# Patient Record
Sex: Male | Born: 1960 | Race: White | Hispanic: No | Marital: Married | Smoking: Never smoker
Health system: Southern US, Community
[De-identification: ages and names within clinical notes are randomized; demographics above are authoritative.]

## PROBLEM LIST (undated history)

## (undated) DIAGNOSIS — L570 Actinic keratosis: Secondary | ICD-10-CM

## (undated) DIAGNOSIS — D099 Carcinoma in situ, unspecified: Secondary | ICD-10-CM

## (undated) DIAGNOSIS — I10 Essential (primary) hypertension: Secondary | ICD-10-CM

## (undated) HISTORY — PX: APPENDECTOMY: SHX54

## (undated) HISTORY — DX: Essential (primary) hypertension: I10

## (undated) HISTORY — DX: Carcinoma in situ, unspecified: D09.9

---
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
## 2005-10-10 ENCOUNTER — Ambulatory Visit: Payer: Self-pay | Admitting: Family Medicine

## 2005-11-01 ENCOUNTER — Ambulatory Visit: Payer: Self-pay | Admitting: General Surgery

## 2005-11-01 LAB — HM COLONOSCOPY

## 2006-08-20 ENCOUNTER — Emergency Department: Payer: Self-pay | Admitting: Emergency Medicine

## 2007-05-07 IMAGING — CT CT STONE STUDY
1 of 2 series · 15 of 32 positions shown, 19 images · non-contrast
Comparison: none

REASON FOR EXAM: History of renal stones, flank pain and hematuria
COMMENTS:   LMP: (Male)

[Series 2: stone · axial · 0.74mm/px · z∈[-484,-72]mm · 15 of 154 slices shown, 19 images]
[im 11/154  soft-tissue]
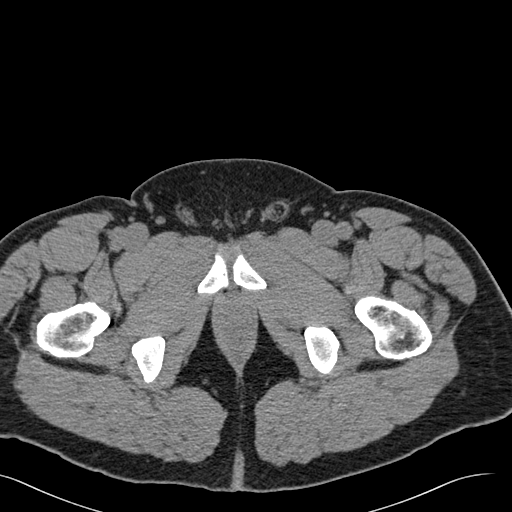
[im 11/154  bone]
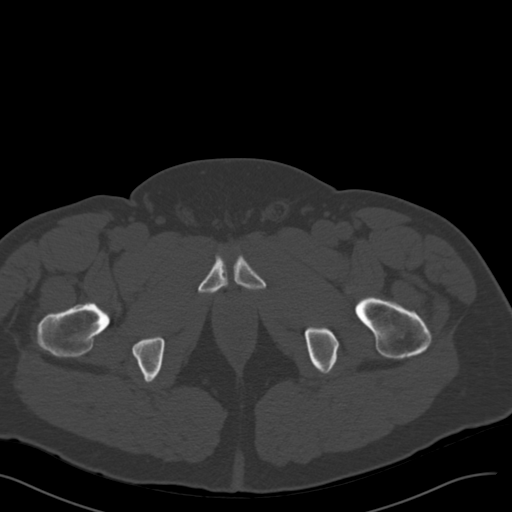
[im 22/154  soft-tissue]
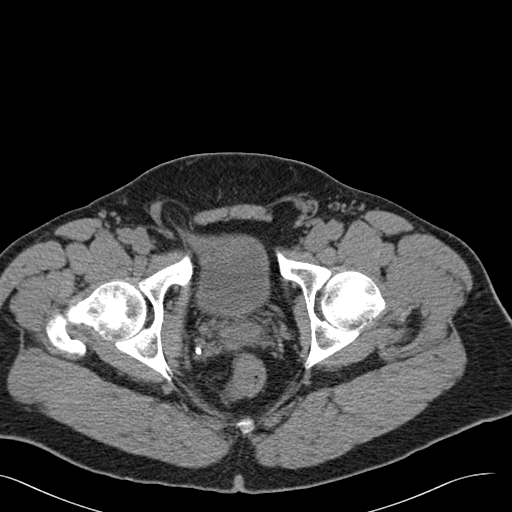
[im 33/154  soft-tissue]
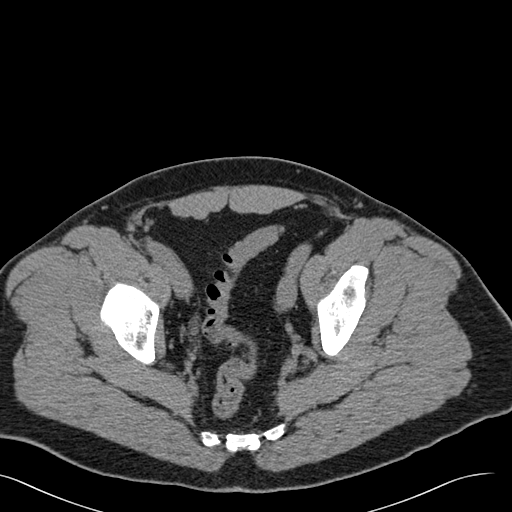
[im 44/154  soft-tissue]
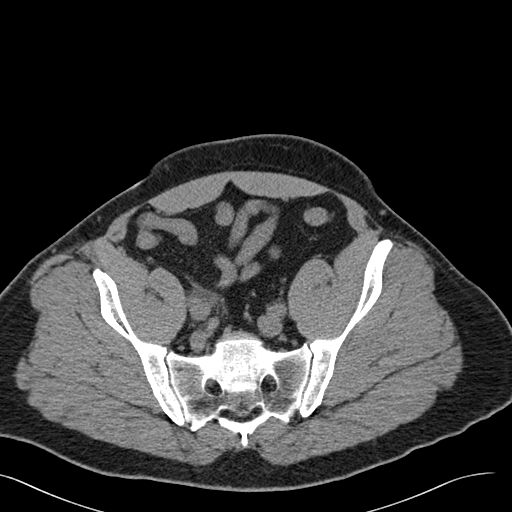
[im 55/154  soft-tissue]
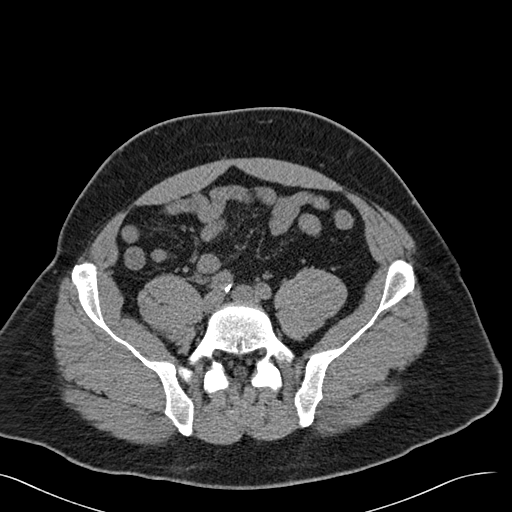
[im 66/154  soft-tissue]
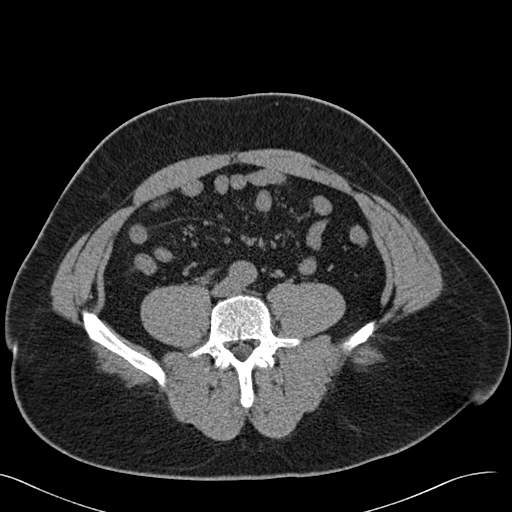
[im 77/154  soft-tissue]
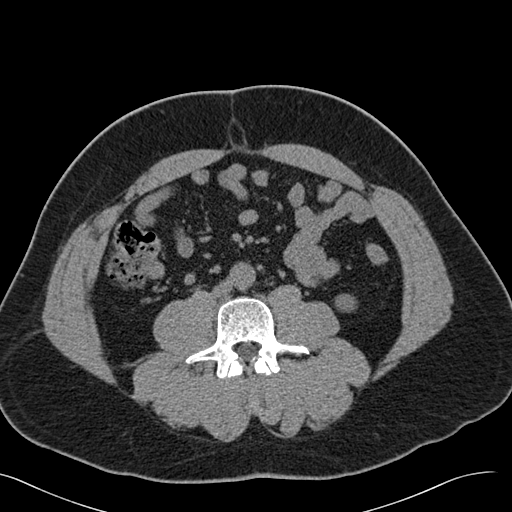
[im 88/154  soft-tissue]
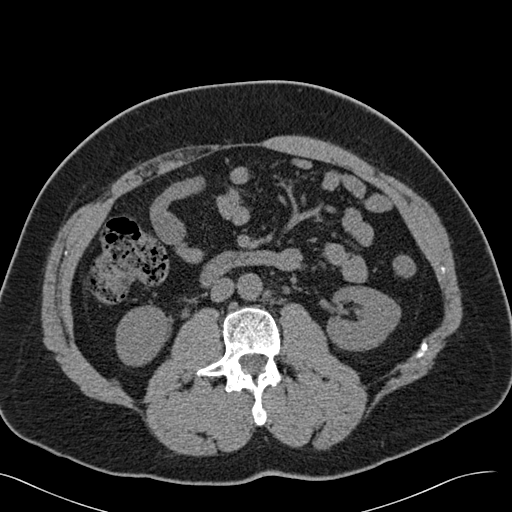
[im 99/154  soft-tissue]
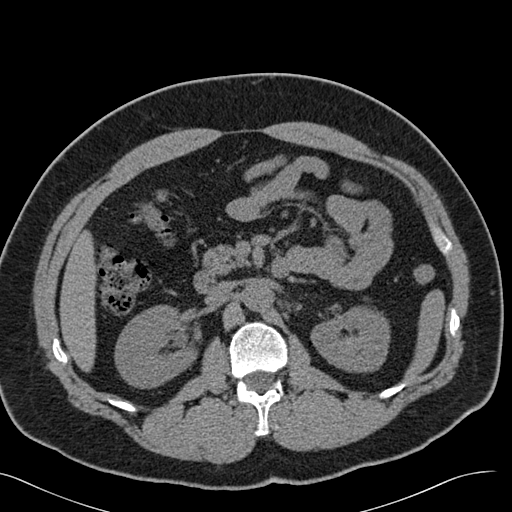
[im 99/154  bone]
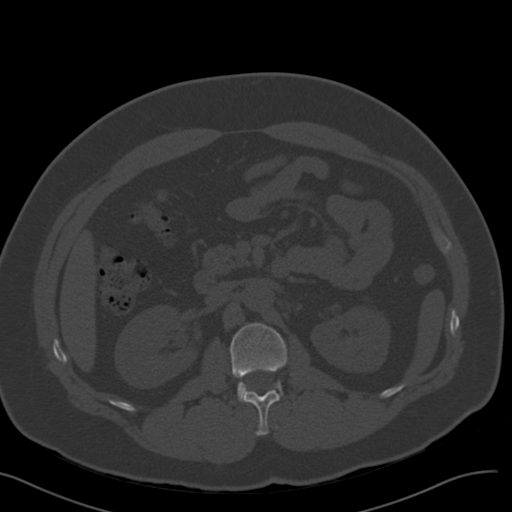
[im 110/154  soft-tissue]
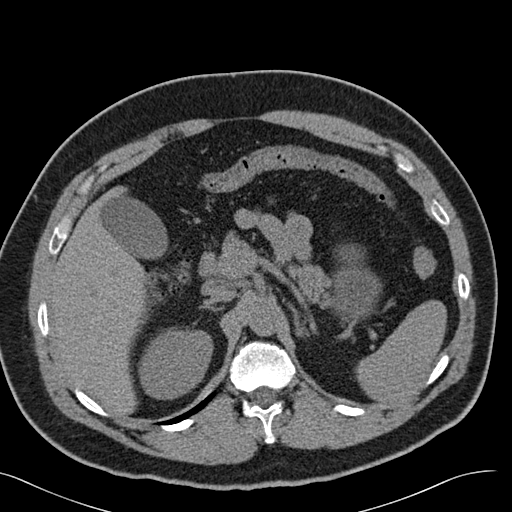
[im 121/154  soft-tissue]
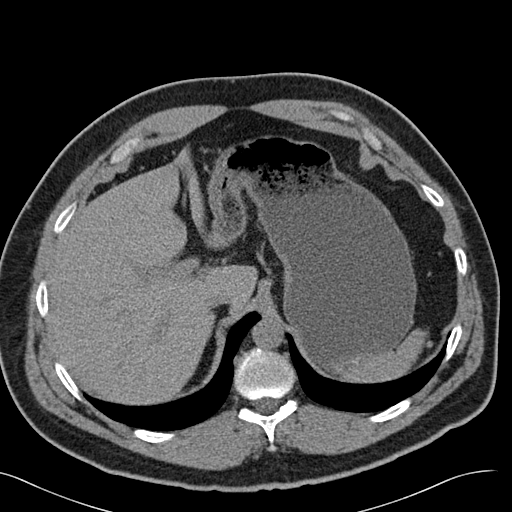
[im 132/154  soft-tissue]
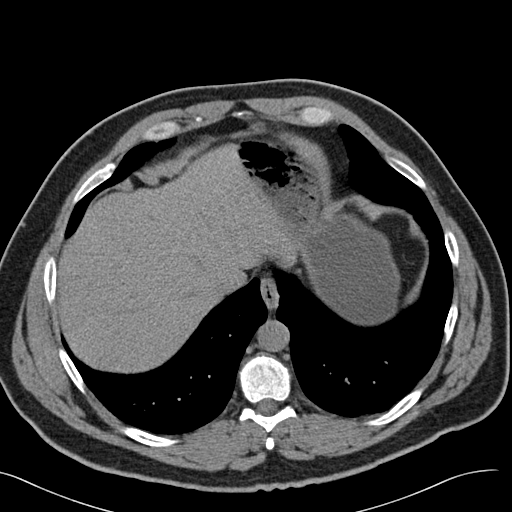
[im 132/154  lung]
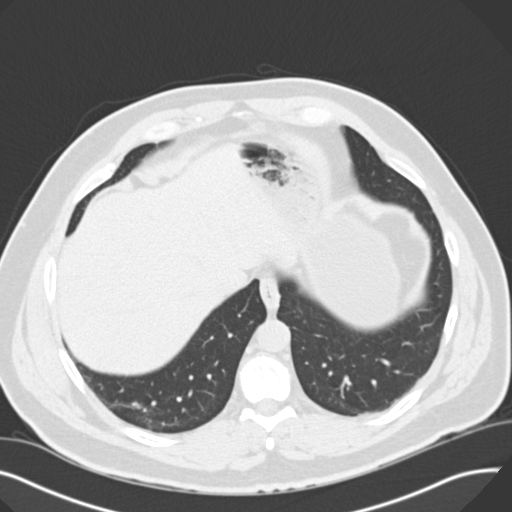
[im 137/154  lung]
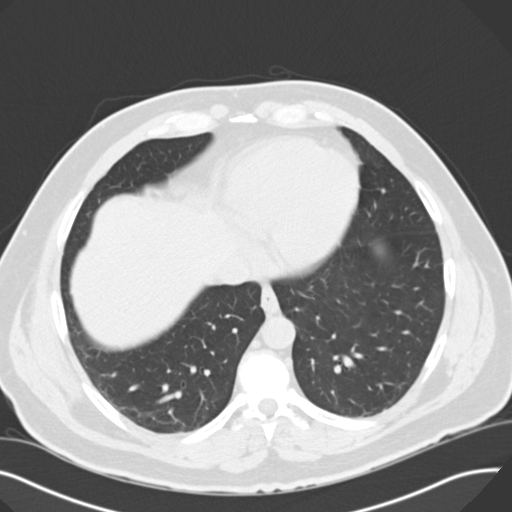
[im 143/154  soft-tissue]
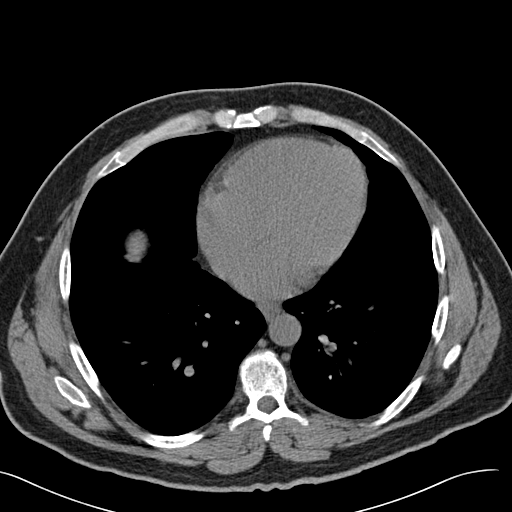
[im 143/154  lung]
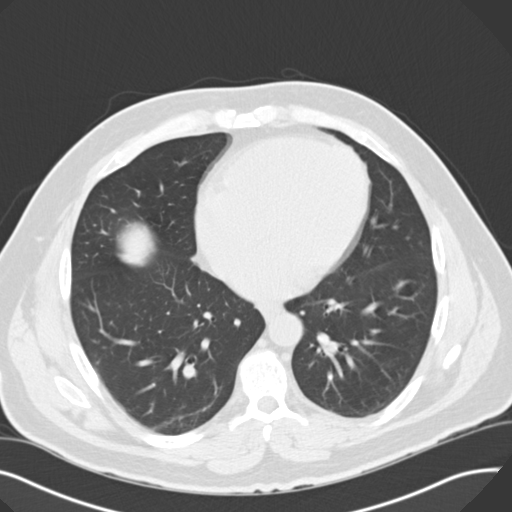
[im 148/154  lung]
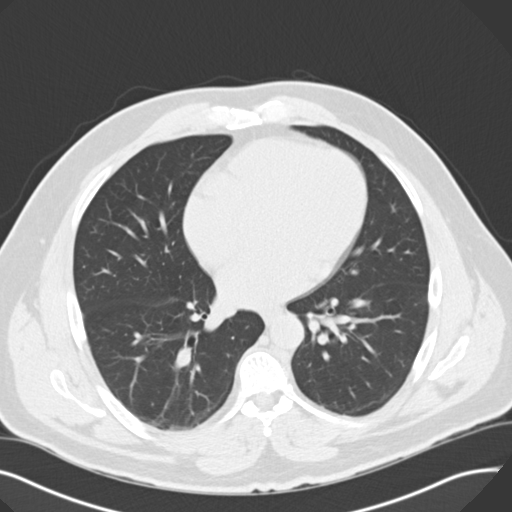

[15 of 32 positions shown; findings below may reference images not displayed]

PROCEDURE:     CT  - CT ABDOMEN /PELVIS WO (STONE)  - August 20, 2006  [DATE]

RESULT:     The patient has a history of RIGHT flank pain and stones.
Comparison is made to study 10 October, 2005. The RIGHT kidney exhibits normal
density. The perinephric fat is normal. There is a tiny calcification in a
lower pole calyx, but there is no evidence of obstruction. The RIGHT ureter,
however, appears very mildly dilated and there is increased density in the
periureteral fat. A stone is seen at the RIGHT ureterovesical junction,
which measures approximately 2 mm in diameter. The LEFT UVJ is normal in
appearance. The LEFT ureter appears normal in caliber with no evidence of
surrounding inflammation. The LEFT kidney exhibits an approximately 4 mm
diameter mid to upper pole non-obstructing stone.

The liver, spleen, pancreas, gallbladder, and adrenal glands are normal in
appearance. The stomach is distended with fluid and food. The caliber of the
abdominal aorta is normal. There is no evidence of ascites. The unopacified
loops of small and large bowel are normal in appearance. The lung bases are
clear. There are small bilateral inguinal hernias that contain fat.
IMPRESSION: 1. There is no significant hydronephrosis on the RIGHT, but very mild
hydroureter is present with a 2 mm diameter ureterovesical junction stone
present. There is no evidence of obstruction on the LEFT. Non-obstructing
stones are present bilaterally.
2. I see no acute abnormality elsewhere within the abdomen or pelvis.

The findings were called to the [HOSPITAL] the conclusion of
the study.

## 2007-11-07 ENCOUNTER — Emergency Department: Payer: Self-pay | Admitting: Emergency Medicine

## 2008-03-10 DIAGNOSIS — C4491 Basal cell carcinoma of skin, unspecified: Secondary | ICD-10-CM

## 2008-03-10 HISTORY — DX: Basal cell carcinoma of skin, unspecified: C44.91

## 2009-09-25 ENCOUNTER — Emergency Department: Payer: Self-pay | Admitting: Internal Medicine

## 2009-10-01 ENCOUNTER — Emergency Department: Payer: Self-pay | Admitting: Emergency Medicine

## 2010-10-11 ENCOUNTER — Ambulatory Visit: Payer: Self-pay | Admitting: Family Medicine

## 2011-06-28 IMAGING — RF DG BARIUM SWALLOW
1 series · 12 of 12 positions shown · non-contrast
Comparison: none

REASON FOR EXAM: dysphagia
COMMENTS:

[Series 1: run · 12 of 12 slices shown]
[im 1/12]
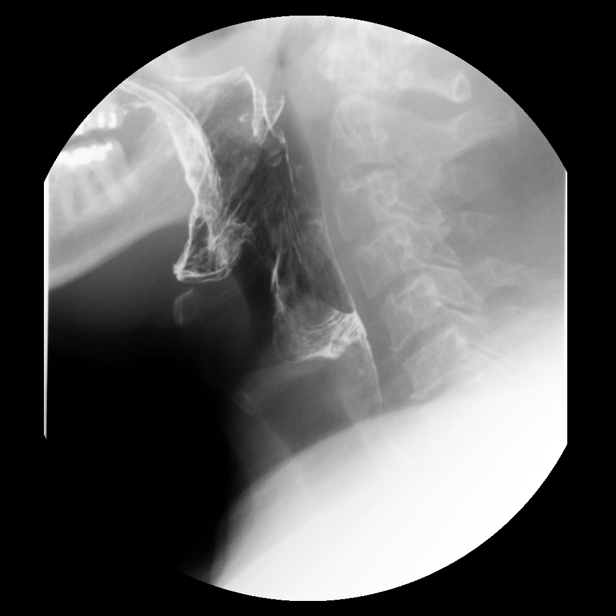
[im 2/12]
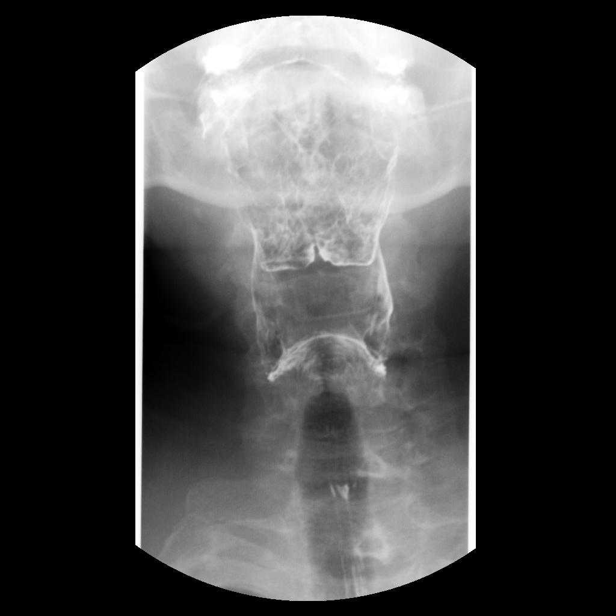
[im 3/12]
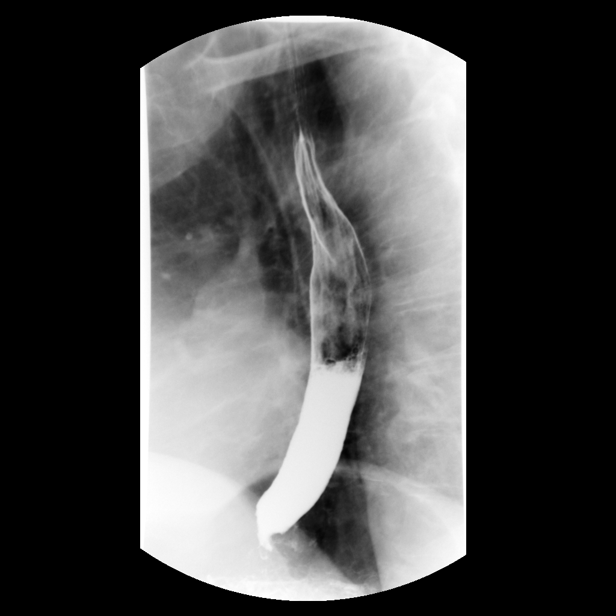
[im 4/12]
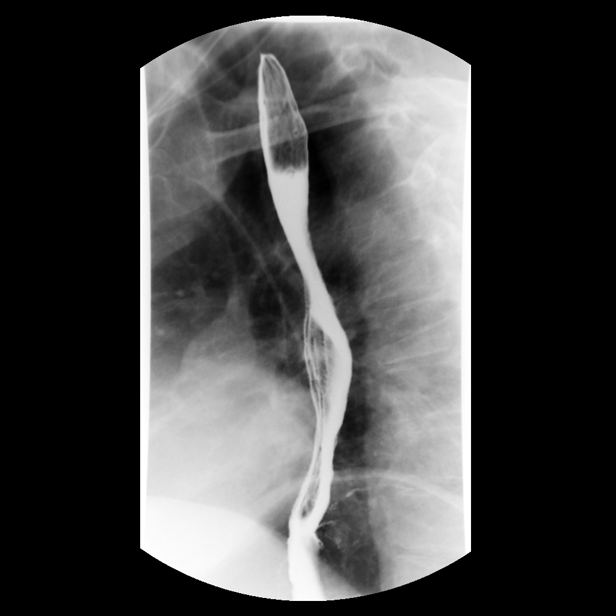
[im 5/12]
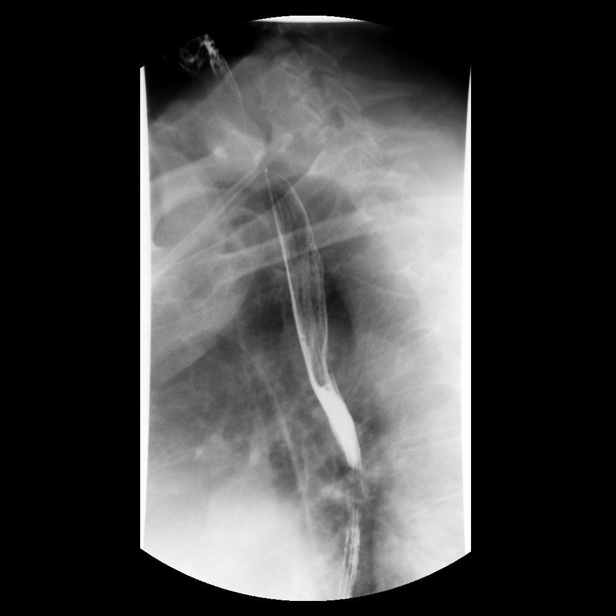
[im 6/12]
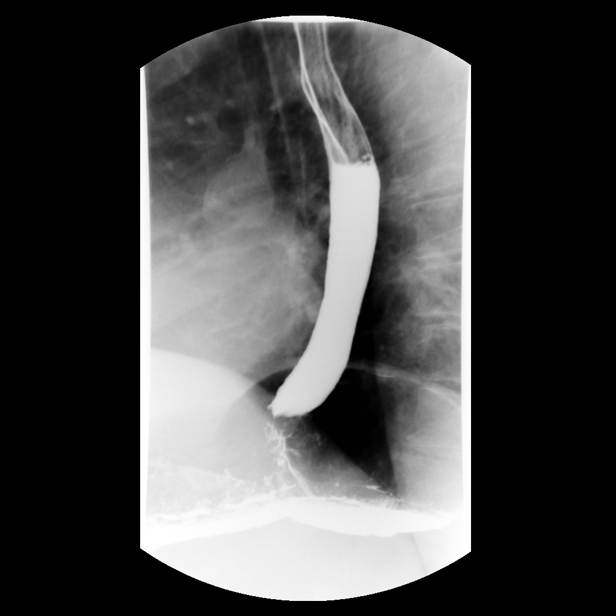
[im 7/12]
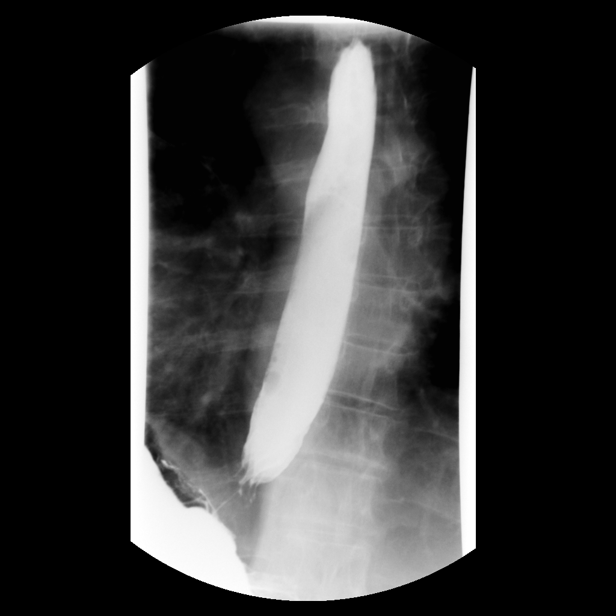
[im 8/12]
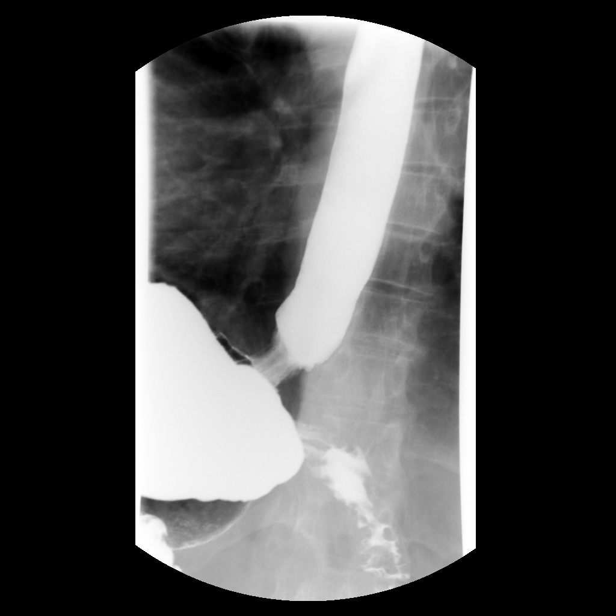
[im 9/12]
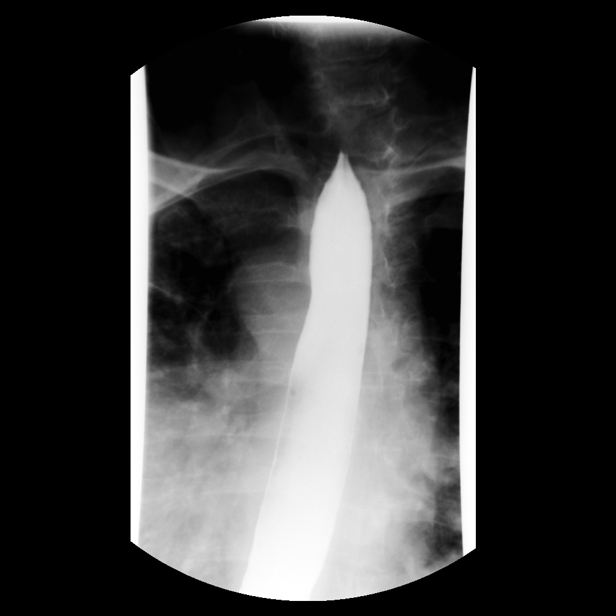
[im 10/12]
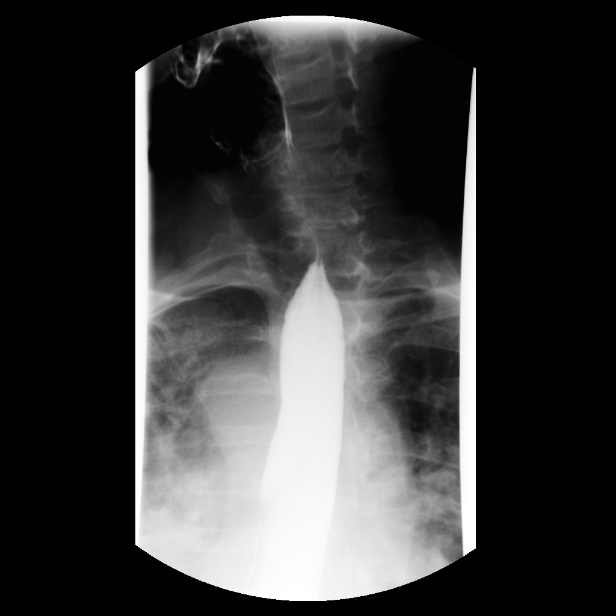
[im 11/12]
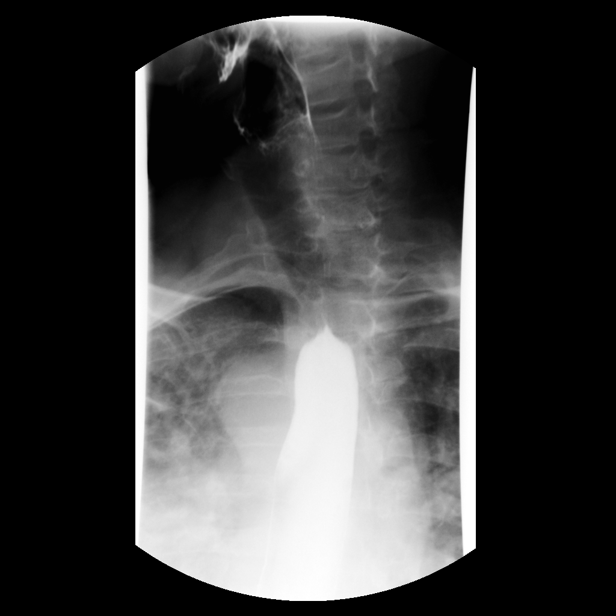
[im 12/12]
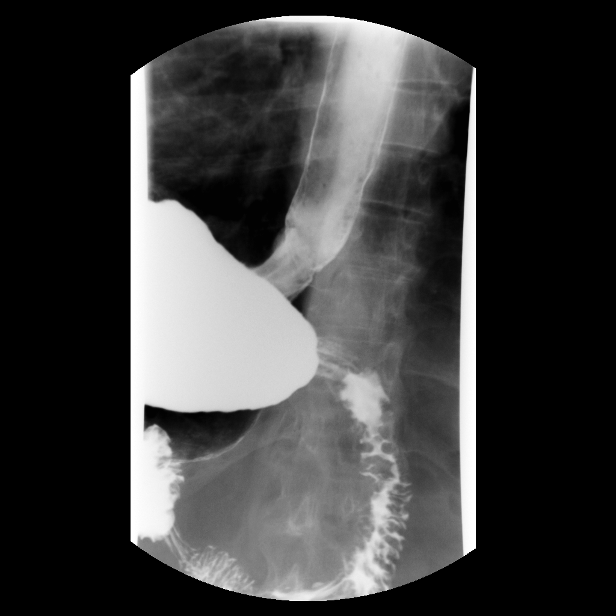

[12 of 12 positions shown; findings below may reference images not displayed]

PROCEDURE:     FL  - FL BARIUM SWALLOW  - October 11, 2010  [DATE]

RESULT:     The anticipated procedure was discussed with Mr. there now. He
voiced his willingness to proceed. The patient ingested barium without
difficulty. The cervical esophagus distended well. There was no laryngeal
penetration of the barium. The thoracic esophagus distended well. Esophageal
motility appeared normal. No gastroesophageal reflux was demonstrated. There
was no evidence of a hiatal hernia. The 12 mm barium pill passed without
difficulty.
IMPRESSION: Normal upper GI series.

## 2011-11-07 DIAGNOSIS — C4492 Squamous cell carcinoma of skin, unspecified: Secondary | ICD-10-CM

## 2011-11-07 HISTORY — DX: Squamous cell carcinoma of skin, unspecified: C44.92

## 2013-11-21 DIAGNOSIS — D099 Carcinoma in situ, unspecified: Secondary | ICD-10-CM

## 2013-11-21 HISTORY — DX: Carcinoma in situ, unspecified: D09.9

## 2017-06-15 ENCOUNTER — Encounter: Payer: Self-pay | Admitting: Physician Assistant

## 2017-06-15 ENCOUNTER — Ambulatory Visit (INDEPENDENT_AMBULATORY_CARE_PROVIDER_SITE_OTHER): Payer: BLUE CROSS/BLUE SHIELD | Admitting: Physician Assistant

## 2017-06-15 VITALS — BP 148/92 | HR 84 | Temp 98.8°F | Resp 16 | Ht 65.0 in | Wt 205.0 lb

## 2017-06-15 DIAGNOSIS — Z1329 Encounter for screening for other suspected endocrine disorder: Secondary | ICD-10-CM | POA: Diagnosis not present

## 2017-06-15 DIAGNOSIS — Z130 Encounter for screening for diseases of the blood and blood-forming organs and certain disorders involving the immune mechanism: Secondary | ICD-10-CM

## 2017-06-15 DIAGNOSIS — Z13220 Encounter for screening for lipoid disorders: Secondary | ICD-10-CM

## 2017-06-15 DIAGNOSIS — R030 Elevated blood-pressure reading, without diagnosis of hypertension: Secondary | ICD-10-CM

## 2017-06-15 DIAGNOSIS — Z1159 Encounter for screening for other viral diseases: Secondary | ICD-10-CM

## 2017-06-15 DIAGNOSIS — Z114 Encounter for screening for human immunodeficiency virus [HIV]: Secondary | ICD-10-CM | POA: Diagnosis not present

## 2017-06-15 DIAGNOSIS — Z85828 Personal history of other malignant neoplasm of skin: Secondary | ICD-10-CM

## 2017-06-15 DIAGNOSIS — Z131 Encounter for screening for diabetes mellitus: Secondary | ICD-10-CM | POA: Diagnosis not present

## 2017-06-15 DIAGNOSIS — Z0000 Encounter for general adult medical examination without abnormal findings: Secondary | ICD-10-CM

## 2017-06-15 DIAGNOSIS — Z125 Encounter for screening for malignant neoplasm of prostate: Secondary | ICD-10-CM

## 2017-06-15 NOTE — Patient Instructions (Signed)

## 2017-06-15 NOTE — Progress Notes (Signed)
Patient: Curtis Combs, Male    DOB: 1960/09/25, 57 y.o.   MRN: [id number] Visit Date: 06/16/2017  Today's Provider: Trinna Post, PA-C   Chief Complaint  Patient presents with  . Establish Care  . Annual Exam   Subjective:    Annual physical exam Curtis Combs is a 57 y.o. male who presents today for health maintenance and complete physical. He feels well. He reports exercising some. He reports he is sleeping well.  He was a patient here > 5 years ago, hasn't been seen since then. Previously saw Dr. Caryn Section.   He is living in [place], [place] with his wife of 33 years. He is a Forensic scientist.  Kids: two kids, son 27 and son 70 Grandkids: two,  girl 4 yrs, boy 4 mo  He reports he had colonoscopy by Dr Bary Castilla several years ago which he reports was normal.   Grandmother with colon cancer, but no 1st degree relatives. No hx prostate cancer.  He has a history of kidney stones and left ureteral stent placed by Dr. Jacqlyn Larsen at [facility], [place]. He has been advised to increase clear fluids.   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 He goes for a skin check every year. -----------------------------------------------------------------   Review of Systems  Constitutional: Negative.   HENT: Negative.   Eyes: Negative.   Respiratory: Negative.   Cardiovascular: Negative.   Gastrointestinal: Negative.   Endocrine: Negative.   Genitourinary: Negative.   Musculoskeletal: Negative.   Skin: Negative.   Allergic/Immunologic: Negative.   Neurological: Negative.   Hematological: Negative.   Psychiatric/Behavioral: Negative.     Social History      He  reports that  has never smoked. he has never used smokeless tobacco. He reports that he does not drink alcohol or use drugs.       Social History   Socioeconomic History  . Marital status: Married    Spouse name: None  . Number of children: None  . Years of education:  None  . Highest education level: None  Social Needs  . Financial resource strain: None  . Food insecurity - worry: None  . Food insecurity - inability: None  . Transportation needs - medical: None  . Transportation needs - non-medical: None  Occupational History  . None  Tobacco Use  . Smoking status: Never Smoker  . Smokeless tobacco: Never Used  Substance and Sexual Activity  . Alcohol use: No    Frequency: Never  . Drug use: No  . Sexual activity: None  Other Topics Concern  . None  Social History Narrative  . None    History reviewed. No pertinent past medical history.   There are no active problems to display for this patient.   Past Surgical History:  Procedure Laterality Date  . APPENDECTOMY    . [place]    Family History        Family Status  Relation Name Status  . Mother  Alive  . Father  Alive  . Brother  Alive  . MGM  Deceased  . Mat Aunt  (Not Specified)        His family history includes Cirrhosis in his maternal aunt and mother; Colon cancer in his maternal grandmother; Diabetes in his mother; Diverticulitis in his mother; Healthy in his brother.      No Known Allergies   Current Outpatient Medications:  .  aspirin EC 81 MG tablet, Take 81 mg by mouth., Disp: , Rfl:    Patient Care Team: Paulene Floor as PCP - General (Physician Assistant)      Objective:   Vitals: BP (!) 148/92 (BP Location: Right Arm, Patient Position: Sitting, Cuff Size: Normal)   Pulse 84   Temp 98.8 F (37.1 C) (Oral)   Resp 16   Ht 5\' 5"  (1.651 m)   Wt 205 lb (93 kg)   BMI 34.11 kg/m    Vitals:   06/15/17 1514  BP: (!) 148/92  Pulse: 84  Resp: 16  Temp: 98.8 F (37.1 C)  TempSrc: Oral  Weight: 205 lb (93 kg)  Height: 5\' 5"  (1.651 m)     Physical Exam  Constitutional: He is oriented to person, place, and time. He appears well-developed and well-nourished.  HENT:  Right Ear: External ear normal.  Left Ear: External ear  normal.  Eyes: Conjunctivae are normal.  Neck: Neck supple.  Cardiovascular: Normal rate and regular rhythm.  Pulmonary/Chest: Effort normal and breath sounds normal.  Genitourinary:  Genitourinary Comments: DRE declined.  Lymphadenopathy:    He has no cervical adenopathy.  Neurological: He is alert and oriented to person, place, and time.  Skin: Skin is warm and dry.  Psychiatric: He has a normal mood and affect. His behavior is normal.     Depression Screen PHQ 2/9 Scores 06/16/2017  Exception Documentation Patient refusal      Assessment & Plan:     Routine Health Maintenance and Physical Exam  Exercise Activities and Dietary recommendations Goals    None      Immunization History  Administered Date(s) Administered  . Tdap 10/18/2011    Health Maintenance  Topic Date Due  . Hepatitis C Screening  01/02/1962  . HIV Screening  06/21/1976  . COLONOSCOPY  06/22/2011  . TETANUS/TDAP  10/17/2021  . INFLUENZA VACCINE  Completed     Discussed health benefits of physical activity, and encouraged him to engage in regular exercise appropriate for his age and condition.    1. Annual physical exam  Requesting colonoscopy records from Dr. Bary Castilla.  2. Encounter for hepatitis C screening test for low risk patient  - Hepatitis c antibody (reflex)  3. Encounter for screening for HIV  - HIV antibody (with reflex)  4. Prostate cancer screening  - PSA  5. Screening cholesterol level  - Lipid Profile  6. Thyroid disorder screen  - TSH  7. Diabetes mellitus screening  - Comprehensive Metabolic Panel (CMET)  8. Screening for deficiency anemia  - CBC with Differential  9. History of SCC (squamous cell carcinoma) of skin  Continue yearly skin checks with Dr. Nehemiah Massed.   10. Elevated BP without diagnosis of hypertension  Blood pressure elevated today and elevated on recheck. Clinic notes from 5 years ago show borderline elevated BP. Suggest diet and  exercise modification to reduce BP. If BP not adequately reduced, suggest starting medication. Will check in 3 weeks.  Return in about 3 weeks (around 07/06/2017) for bp.  The entirety of the information documented in the History of Present Illness, Review of Systems and Physical Exam were personally obtained by me. Portions of this information were initially documented by Ashley Royalty, CMA and reviewed by me for thoroughness and accuracy.    --------------------------------------------------------------------    Trinna Post, PA-C  [facility]

## 2017-06-16 ENCOUNTER — Encounter: Payer: Self-pay | Admitting: Physician Assistant

## 2017-06-17 LAB — LIPID PANEL
Chol/HDL Ratio: 3 ratio (ref 0.0–5.0)
Cholesterol, Total: 161 mg/dL (ref 100–199)
HDL: 54 mg/dL (ref >39)
LDL Calculated: 94 mg/dL (ref 0–99)
Triglycerides: 65 mg/dL (ref 0–149)
VLDL Cholesterol Cal: 13 mg/dL (ref 5–40)

## 2017-06-17 LAB — COMPREHENSIVE METABOLIC PANEL
ALT: 30 IU/L (ref 0–44)
AST: 26 IU/L (ref 0–40)
Albumin/Globulin Ratio: 1.7 (ref 1.2–2.2)
Albumin: 4.3 g/dL (ref 3.5–5.5)
Alkaline Phosphatase: 72 IU/L (ref 39–117)
BUN/Creatinine Ratio: 14 (ref 9–20)
BUN: 14 mg/dL (ref 6–24)
Bilirubin Total: 0.9 mg/dL (ref 0.0–1.2)
CO2: 20 mmol/L (ref 20–29)
Calcium: 9.4 mg/dL (ref 8.7–10.2)
Chloride: 104 mmol/L (ref 96–106)
Creatinine, Ser: 1.03 mg/dL (ref 0.76–1.27)
GFR calc Af Amer: 94 mL/min/{1.73_m2} (ref >59)
GFR calc non Af Amer: 81 mL/min/{1.73_m2} (ref >59)
Globulin, Total: 2.6 g/dL (ref 1.5–4.5)
Glucose: 93 mg/dL (ref 65–99)
Potassium: 4.5 mmol/L (ref 3.5–5.2)
Sodium: 138 mmol/L (ref 134–144)
Total Protein: 6.9 g/dL (ref 6.0–8.5)

## 2017-06-17 LAB — CBC WITH DIFFERENTIAL/PLATELET
Basophils Absolute: 0 10*3/uL (ref 0.0–0.2)
Basos: 1 %
EOS (ABSOLUTE): 0.2 10*3/uL (ref 0.0–0.4)
Eos: 2 %
Hematocrit: 47.6 % (ref 37.5–51.0)
Hemoglobin: 15.7 g/dL (ref 13.0–17.7)
Immature Grans (Abs): 0 10*3/uL (ref 0.0–0.1)
Immature Granulocytes: 0 %
Lymphocytes Absolute: 2.6 10*3/uL (ref 0.7–3.1)
Lymphs: 38 %
MCH: 29.5 pg (ref 26.6–33.0)
MCHC: 33 g/dL (ref 31.5–35.7)
MCV: 89 fL (ref 79–97)
Monocytes Absolute: 0.6 10*3/uL (ref 0.1–0.9)
Monocytes: 8 %
Neutrophils Absolute: 3.5 10*3/uL (ref 1.4–7.0)
Neutrophils: 51 %
Platelets: 288 10*3/uL (ref 150–379)
RBC: 5.33 x10E6/uL (ref 4.14–5.80)
RDW: 13.2 % (ref 12.3–15.4)
WBC: 6.9 10*3/uL (ref 3.4–10.8)

## 2017-06-17 LAB — HIV ANTIBODY (ROUTINE TESTING W REFLEX): HIV Screen 4th Generation wRfx: NONREACTIVE

## 2017-06-17 LAB — HEPATITIS C ANTIBODY (REFLEX): HCV Ab: <0.1 s/co ratio (ref 0.0–0.9)

## 2017-06-17 LAB — HCV COMMENT:

## 2017-06-17 LAB — TSH: TSH: 1.83 u[IU]/mL (ref 0.450–4.500)

## 2017-06-17 LAB — PSA: Prostate Specific Ag, Serum: 0.6 ng/mL (ref 0.0–4.0)

## 2017-06-19 ENCOUNTER — Telehealth: Payer: Self-pay

## 2017-06-19 ENCOUNTER — Ambulatory Visit: Payer: Self-pay | Admitting: Physician Assistant

## 2017-06-19 NOTE — Telephone Encounter (Signed)
-----   Message from Trinna Post, [place] sent at 06/17/2017  9:06 AM EST ----- CMET no DM, normal kidney and liver function. CBC no infection or anemia. Cholesterol normal. TSH normal. PSA normal. HCV and HIV non reactive.

## 2017-06-19 NOTE — Telephone Encounter (Signed)
Tried calling; no answer.   Thanks,   -Laura  

## 2017-06-20 NOTE — Telephone Encounter (Signed)
Pt advised.   Thanks,   -Laura  

## 2017-06-20 NOTE — Telephone Encounter (Signed)
LMTCB 06/20/2017   Thanks,   -Mickel Baas

## 2017-07-03 ENCOUNTER — Ambulatory Visit (INDEPENDENT_AMBULATORY_CARE_PROVIDER_SITE_OTHER): Payer: BLUE CROSS/BLUE SHIELD | Admitting: Physician Assistant

## 2017-07-03 ENCOUNTER — Telehealth: Payer: Self-pay | Admitting: Physician Assistant

## 2017-07-03 ENCOUNTER — Encounter: Payer: Self-pay | Admitting: Physician Assistant

## 2017-07-03 VITALS — HR 84 | Temp 98.5°F | Resp 16 | Wt 204.0 lb

## 2017-07-03 DIAGNOSIS — I10 Essential (primary) hypertension: Secondary | ICD-10-CM | POA: Diagnosis not present

## 2017-07-03 DIAGNOSIS — Z1211 Encounter for screening for malignant neoplasm of colon: Secondary | ICD-10-CM

## 2017-07-03 MED ORDER — AMLODIPINE BESYLATE 5 MG PO TABS: 5.0000 mg | ORAL_TABLET | Freq: Every day | ORAL | 0 refills | Status: DC

## 2017-07-03 NOTE — Patient Instructions (Signed)

## 2017-07-03 NOTE — Telephone Encounter (Signed)
Order for cologuard faxed to [facility] °

## 2017-07-03 NOTE — Progress Notes (Signed)
       Patient: Curtis Combs Male    DOB: [birth year]   57 y.o.   MRN: [id number] Visit Date: 07/03/2017  Today's Provider: Trinna Post, PA-C   Chief Complaint  Patient presents with  . Hypertension    Elevated BP; One month follow up   Subjective:    Curtis Combs is a 57 y/o man presenting today for high BP. His blood pressure was elevated three weeks ago and he is here for recheck. Weight is stable. Cholesterol well controlled.   Hypertension  This is a new problem. 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 ). Pertinent negatives include no anxiety, blurred vision, chest pain, headaches, malaise/fatigue, neck pain, orthopnea, palpitations, peripheral edema, PND, shortness of breath or sweats. There are no associated agents to hypertension. Past treatments include lifestyle changes. There are no compliance problems.        No Known Allergies   Current Outpatient Medications:  .  aspirin EC 81 MG tablet, Take 81 mg by mouth., Disp: , Rfl:   Review of Systems  Constitutional: Negative.  Negative for malaise/fatigue.  Eyes: Negative for blurred vision.  Respiratory: Negative.  Negative for shortness of breath.   Cardiovascular: Negative.  Negative for chest pain, palpitations, orthopnea and PND.  Gastrointestinal: Negative.   Musculoskeletal: Negative for neck pain.  Neurological: Negative for dizziness, light-headedness and headaches.    Social History   Tobacco Use  . Smoking status: Never Smoker  . Smokeless tobacco: Never Used  Substance Use Topics  . Alcohol use: No    Frequency: Never   Objective:   Pulse 84   Temp 98.5 F (36.9 C) (Oral)   Resp 16   Wt 204 lb (92.5 kg)   BMI 33.95 kg/m  Vitals:   07/03/17 1538  Pulse: 84  Resp: 16  Temp: 98.5 F (36.9 C)  TempSrc: Oral  Weight: 204 lb (92.5 kg)     Physical Exam  Constitutional: He is oriented to person, place, and time. He appears  well-developed and well-nourished.  Cardiovascular: Normal rate and regular rhythm.  Pulmonary/Chest: Effort normal and breath sounds normal.  Musculoskeletal: He exhibits no edema.  Neurological: He is alert and oriented to person, place, and time.  Skin: Skin is warm and dry.  Psychiatric: He has a normal mood and affect. His behavior is normal.        Assessment & Plan:     1. Hypertension, unspecified type  Uncontrolled HTN, will begin norvasc as below. Counseled on pedal edema. Encouraged increased exercise and weight loss, if BP decreases with these interventions we may discontinue the medication.   - amLODipine (NORVASC) 5 MG tablet; Take 1 tablet (5 mg total) by mouth daily.  Dispense: 90 tablet; Refill: 0  2. Colon cancer screening  - Cologuard  Return in about 1 month (around 07/31/2017) for HTN.  The entirety of the information documented in the History of Present Illness, Review of Systems and Physical Exam were personally obtained by me. Portions of this information were initially documented by Ashley Royalty, CMA and reviewed by me for thoroughness and accuracy.         Trinna Post, PA-C  [facility]

## 2017-07-04 DIAGNOSIS — I10 Essential (primary) hypertension: Secondary | ICD-10-CM | POA: Insufficient documentation

## 2017-07-05 ENCOUNTER — Encounter: Payer: Self-pay | Admitting: Physician Assistant

## 2017-07-10 LAB — COLOGUARD: COLOGUARD: NEGATIVE

## 2017-07-25 ENCOUNTER — Encounter: Payer: Self-pay | Admitting: Physician Assistant

## 2017-07-31 ENCOUNTER — Ambulatory Visit (INDEPENDENT_AMBULATORY_CARE_PROVIDER_SITE_OTHER): Payer: Managed Care, Other (non HMO) | Admitting: Physician Assistant

## 2017-07-31 ENCOUNTER — Encounter: Payer: Self-pay | Admitting: Physician Assistant

## 2017-07-31 VITALS — BP 116/84 | HR 68 | Temp 98.6°F | Resp 16 | Wt 199.0 lb

## 2017-07-31 DIAGNOSIS — I10 Essential (primary) hypertension: Secondary | ICD-10-CM | POA: Diagnosis not present

## 2017-07-31 NOTE — Progress Notes (Signed)
Patient: Curtis Combs Male    DOB: 1960/11/13   57 y.o.   MRN: [id number] Visit Date: 07/31/2017  Today's Provider: Trinna Post, PA-C   Chief Complaint  Patient presents with  . Hypertension    One month follow up   Subjective:    Curtis Combs is here today for follow up of BP. He has started amlodipine 5 mg daily last visit. He has had some pedal edema but otherwise no side effects. He has lost five pounds since his visit last month. He has been eating better, salads for lunch, working out more.   Hypertension  Associated symptoms include peripheral edema. Pertinent negatives include no anxiety, blurred vision, chest pain, headaches, malaise/fatigue, neck pain, orthopnea, palpitations, PND, shortness of breath or sweats. There are no associated agents to hypertension. Risk factors for coronary artery disease include male gender. Past treatments include calcium channel blockers. There are no compliance problems.    BP Readings from Last 3 Encounters:  07/31/17 116/84  06/15/17 (!) 148/92        No Known Allergies   Current Outpatient Medications:  .  amLODipine (NORVASC) 5 MG tablet, Take 1 tablet (5 mg total) by mouth daily., Disp: 90 tablet, Rfl: 0 .  aspirin EC 81 MG tablet, Take 81 mg by mouth., Disp: , Rfl:   Review of Systems  Constitutional: Negative.  Negative for malaise/fatigue.  Eyes: Negative for blurred vision.  Respiratory: Negative.  Negative for shortness of breath.   Cardiovascular: Positive for leg swelling. Negative for chest pain, palpitations, orthopnea and PND.  Musculoskeletal: Negative for neck pain.  Neurological: Negative for dizziness, light-headedness and headaches.    Social History   Tobacco Use  . Smoking status: Never Smoker  . Smokeless tobacco: Never Used  Substance Use Topics  . Alcohol use: No    Frequency: Never   Objective:   BP 116/84 (BP Location: Right Arm, Patient Position: Sitting, Cuff Size:  Normal)   Pulse 68   Temp 98.6 F (37 C) (Oral)   Resp 16   Wt 199 lb (90.3 kg)   BMI 33.12 kg/m  Vitals:   07/31/17 1608  BP: 116/84  Pulse: 68  Resp: 16  Temp: 98.6 F (37 C)  TempSrc: Oral  Weight: 199 lb (90.3 kg)     Physical Exam  Constitutional: He is oriented to person, place, and time. He appears well-developed and well-nourished.  Cardiovascular: Normal rate and regular rhythm.  Pulmonary/Chest: Effort normal and breath sounds normal.  Musculoskeletal: He exhibits edema.  Mild non pitting edema in extremities bilaterally.   Neurological: He is alert and oriented to person, place, and time.  Skin: Skin is warm and dry.  Psychiatric: He has a normal mood and affect. His behavior is normal.        Assessment & Plan:     1. Hypertension, unspecified type  HTN controlled on 5 mg amlodipine. He does have some pedal edema, which I have counseled is a side effect and if it does not bother him, we may continue medication. He is agreeable. Congratulated on weight loss. Will see him in one year for CPE/follow up.  Return in about 1 year (around 08/01/2018) for CPE.  The entirety of the information documented in the History of Present Illness, Review of Systems and Physical Exam were personally obtained by me. Portions of this information were initially documented by Ashley Royalty, CMA and reviewed by me for thoroughness  and accuracy.          Trinna Post, PA-C  [facility]

## 2017-07-31 NOTE — Patient Instructions (Signed)

## 2017-08-10 LAB — COLOGUARD

## 2017-09-21 ENCOUNTER — Other Ambulatory Visit: Payer: Self-pay | Admitting: Physician Assistant

## 2017-09-21 DIAGNOSIS — I10 Essential (primary) hypertension: Secondary | ICD-10-CM

## 2017-09-22 NOTE — Telephone Encounter (Signed)
Pharmacy requesting refills. Thanks!  

## 2017-12-20 ENCOUNTER — Other Ambulatory Visit: Payer: Self-pay | Admitting: Physician Assistant

## 2017-12-20 DIAGNOSIS — I10 Essential (primary) hypertension: Secondary | ICD-10-CM

## 2018-03-21 ENCOUNTER — Other Ambulatory Visit: Payer: Self-pay | Admitting: Physician Assistant

## 2018-03-21 DIAGNOSIS — I10 Essential (primary) hypertension: Secondary | ICD-10-CM

## 2018-09-14 ENCOUNTER — Other Ambulatory Visit: Payer: Self-pay | Admitting: Physician Assistant

## 2018-09-14 DIAGNOSIS — I10 Essential (primary) hypertension: Secondary | ICD-10-CM

## 2018-09-17 NOTE — Telephone Encounter (Signed)
LOv:07/31/2017

## 2018-09-17 NOTE — Telephone Encounter (Signed)
Please schedule follow up before more refills, not seen > 1 year.

## 2018-09-18 NOTE — Telephone Encounter (Signed)
LMTCB

## 2018-10-12 ENCOUNTER — Other Ambulatory Visit: Payer: Self-pay | Admitting: Physician Assistant

## 2018-10-12 DIAGNOSIS — I10 Essential (primary) hypertension: Secondary | ICD-10-CM

## 2018-10-13 ENCOUNTER — Other Ambulatory Visit: Payer: Self-pay | Admitting: Physician Assistant

## 2018-10-13 DIAGNOSIS — I10 Essential (primary) hypertension: Secondary | ICD-10-CM

## 2018-10-16 ENCOUNTER — Other Ambulatory Visit: Payer: Self-pay

## 2018-10-16 DIAGNOSIS — I10 Essential (primary) hypertension: Secondary | ICD-10-CM

## 2018-10-16 MED ORDER — AMLODIPINE BESYLATE 5 MG PO TABS: 5.0000 mg | ORAL_TABLET | Freq: Every day | ORAL | 1 refills | Status: DC

## 2018-11-19 DIAGNOSIS — C4492 Squamous cell carcinoma of skin, unspecified: Secondary | ICD-10-CM

## 2018-11-19 DIAGNOSIS — L570 Actinic keratosis: Secondary | ICD-10-CM

## 2018-11-19 HISTORY — DX: Actinic keratosis: L57.0

## 2018-11-19 HISTORY — DX: Squamous cell carcinoma of skin, unspecified: C44.92

## 2018-11-27 ENCOUNTER — Ambulatory Visit: Payer: Self-pay | Admitting: Physician Assistant

## 2018-12-17 ENCOUNTER — Encounter: Payer: Self-pay | Admitting: Physician Assistant

## 2018-12-17 ENCOUNTER — Ambulatory Visit: Payer: Managed Care, Other (non HMO) | Admitting: Physician Assistant

## 2018-12-17 ENCOUNTER — Other Ambulatory Visit: Payer: Self-pay

## 2018-12-17 VITALS — BP 120/82 | HR 76 | Temp 98.9°F | Resp 16 | Wt 199.0 lb

## 2018-12-17 DIAGNOSIS — Z125 Encounter for screening for malignant neoplasm of prostate: Secondary | ICD-10-CM | POA: Diagnosis not present

## 2018-12-17 DIAGNOSIS — Z0000 Encounter for general adult medical examination without abnormal findings: Secondary | ICD-10-CM | POA: Diagnosis not present

## 2018-12-17 DIAGNOSIS — I10 Essential (primary) hypertension: Secondary | ICD-10-CM

## 2018-12-17 MED ORDER — AMLODIPINE BESYLATE 5 MG PO TABS: 5.0000 mg | ORAL_TABLET | Freq: Every day | ORAL | 3 refills | Status: DC

## 2018-12-17 NOTE — Progress Notes (Signed)
Patient: Curtis Combs Male    DOB: 04/26/1961   58 y.o.   MRN: [id number] Visit Date: 12/18/2018  Today's Provider: Trinna Post, PA-C   Chief Complaint  Patient presents with  . Hypertension   Subjective:   HPI   Here today for CPE and follow up. Cologuard negative 07/20/2017. PSA 06/16/2017.     Hypertension, follow-up:  BP Readings from Last 3 Encounters:  12/17/18 120/82  07/31/17 116/84  06/15/17 (!) 148/92    He was last seen for hypertension 1 years ago.  BP at that visit was 116/84. Management since that visit includes no changes. He reports good compliance with treatment. He is not having side effects.  He is not exercising. He is adherent to low salt diet.   Outside blood pressures are checked occasionally. He is experiencing none.  Patient denies exertional chest pressure/discomfort, lower extremity edema and palpitations.    Weight trend: stable Wt Readings from Last 3 Encounters:  12/17/18 199 lb (90.3 kg)  07/31/17 199 lb (90.3 kg)  07/03/17 204 lb (92.5 kg)    Current diet: well balanced   No Known Allergies   Current Outpatient Medications:  .  amLODipine (NORVASC) 5 MG tablet, Take 1 tablet (5 mg total) by mouth daily., Disp: 90 tablet, Rfl: 3 .  aspirin EC 81 MG tablet, Take 81 mg by mouth., Disp: , Rfl:   Review of Systems  Constitutional: Negative for activity change and fatigue.  Respiratory: Negative for cough and shortness of breath.   Cardiovascular: Negative for chest pain, palpitations and leg swelling.  Musculoskeletal: Negative for arthralgias.  Allergic/Immunologic: Negative for environmental allergies.  Neurological: Negative for dizziness, light-headedness and headaches.  Psychiatric/Behavioral: Negative for agitation, self-injury and sleep disturbance. The patient is not nervous/anxious.     Social History   Tobacco Use  . Smoking status: Never Smoker  . Smokeless tobacco: Never Used  Substance Use  Topics  . Alcohol use: No    Frequency: Never      Objective:   BP 120/82 (BP Location: Left Arm, Patient Position: Sitting, Cuff Size: Large)   Pulse 76   Temp 98.9 F (37.2 C)   Resp 16   Wt 199 lb (90.3 kg)   SpO2 99%   BMI 33.12 kg/m  Vitals:   12/17/18 1616  BP: 120/82  Pulse: 76  Resp: 16  Temp: 98.9 F (37.2 C)  SpO2: 99%  Weight: 199 lb (90.3 kg)     Physical Exam Constitutional:      Appearance: Normal appearance.  HENT:     Right Ear: Tympanic membrane normal.     Left Ear: Tympanic membrane normal.     Mouth/Throat:     Mouth: Mucous membranes are moist.  Cardiovascular:     Rate and Rhythm: Normal rate and regular rhythm.     Heart sounds: Normal heart sounds.  Pulmonary:     Effort: Pulmonary effort is normal.     Breath sounds: Normal breath sounds.  Abdominal:     General: Abdomen is flat. Bowel sounds are normal.     Palpations: Abdomen is soft.  Skin:    General: Skin is warm and dry.  Neurological:     Mental Status: He is alert and oriented to person, place, and time. Mental status is at baseline.  Psychiatric:        Mood and Affect: Mood normal.        Behavior: Behavior normal.  No results found for any visits on 12/17/18.     Assessment & Plan    1. Annual physical exam  Screening exams up to date.   2. Hypertension, unspecified type  - Comprehensive Metabolic Panel (CMET) - Lipid Profile - amLODipine (NORVASC) 5 MG tablet; Take 1 tablet (5 mg total) by mouth daily.  Dispense: 90 tablet; Refill: 3  3. Prostate cancer screening  - PSA  The entirety of the information documented in the History of Present Illness, Review of Systems and Physical Exam were personally obtained by me. Portions of this information were initially documented by Wilburt Finlay, CMA and reviewed by me for thoroughness and accuracy.   F/u 1 year      Trinna Post, PA-C  [facility]

## 2018-12-20 LAB — COMPREHENSIVE METABOLIC PANEL
ALT: 28 IU/L (ref 0–44)
AST: 26 IU/L (ref 0–40)
Albumin/Globulin Ratio: 1.8 (ref 1.2–2.2)
Albumin: 4.3 g/dL (ref 3.8–4.9)
Alkaline Phosphatase: 66 IU/L (ref 39–117)
BUN/Creatinine Ratio: 13 (ref 9–20)
BUN: 14 mg/dL (ref 6–24)
Bilirubin Total: 0.6 mg/dL (ref 0.0–1.2)
CO2: 21 mmol/L (ref 20–29)
Calcium: 9.3 mg/dL (ref 8.7–10.2)
Chloride: 102 mmol/L (ref 96–106)
Creatinine, Ser: 1.1 mg/dL (ref 0.76–1.27)
GFR calc Af Amer: 86 mL/min/{1.73_m2} (ref >59)
GFR calc non Af Amer: 74 mL/min/{1.73_m2} (ref >59)
Globulin, Total: 2.4 g/dL (ref 1.5–4.5)
Glucose: 97 mg/dL (ref 65–99)
Potassium: 4.4 mmol/L (ref 3.5–5.2)
Sodium: 138 mmol/L (ref 134–144)
Total Protein: 6.7 g/dL (ref 6.0–8.5)

## 2018-12-20 LAB — LIPID PANEL
Chol/HDL Ratio: 3.1 ratio (ref 0.0–5.0)
Cholesterol, Total: 166 mg/dL (ref 100–199)
HDL: 53 mg/dL (ref >39)
LDL Calculated: 101 mg/dL — ABNORMAL HIGH (ref 0–99)
Triglycerides: 60 mg/dL (ref 0–149)
VLDL Cholesterol Cal: 12 mg/dL (ref 5–40)

## 2018-12-20 LAB — PSA: Prostate Specific Ag, Serum: 0.5 ng/mL (ref 0.0–4.0)

## 2018-12-24 ENCOUNTER — Telehealth: Payer: Self-pay

## 2018-12-24 NOTE — Telephone Encounter (Signed)
-----   Message from Trinna Post, [place] sent at 12/21/2018 12:54 PM EDT ----- Labs look great. Will see him next year.

## 2018-12-24 NOTE — Telephone Encounter (Signed)
Patient notified of lab results

## 2019-06-04 ENCOUNTER — Other Ambulatory Visit: Payer: Self-pay | Admitting: Physician Assistant

## 2019-06-04 DIAGNOSIS — I10 Essential (primary) hypertension: Secondary | ICD-10-CM

## 2019-06-04 MED ORDER — AMLODIPINE BESYLATE 5 MG PO TABS: 5.0000 mg | ORAL_TABLET | Freq: Every day | ORAL | 3 refills | Status: DC

## 2019-06-04 NOTE — Telephone Encounter (Signed)
L.O.V. was on 12/17/2018 and medication send into pharmacy.

## 2019-06-04 NOTE — Telephone Encounter (Signed)
[facility] faxed refill request for the following medications:  amLODipine (NORVASC) 5 MG tablet   Please advise.

## 2019-07-24 ENCOUNTER — Ambulatory Visit (INDEPENDENT_AMBULATORY_CARE_PROVIDER_SITE_OTHER): Payer: Managed Care, Other (non HMO) | Admitting: Dermatology

## 2019-07-24 ENCOUNTER — Encounter: Payer: Self-pay | Admitting: Dermatology

## 2019-07-24 ENCOUNTER — Other Ambulatory Visit: Payer: Self-pay

## 2019-07-24 DIAGNOSIS — Z86007 Personal history of in-situ neoplasm of skin: Secondary | ICD-10-CM

## 2019-07-24 DIAGNOSIS — C4441 Basal cell carcinoma of skin of scalp and neck: Secondary | ICD-10-CM | POA: Diagnosis not present

## 2019-07-24 DIAGNOSIS — D492 Neoplasm of unspecified behavior of bone, soft tissue, and skin: Secondary | ICD-10-CM

## 2019-07-24 DIAGNOSIS — Z1283 Encounter for screening for malignant neoplasm of skin: Secondary | ICD-10-CM | POA: Diagnosis not present

## 2019-07-24 DIAGNOSIS — L570 Actinic keratosis: Secondary | ICD-10-CM | POA: Diagnosis not present

## 2019-07-24 DIAGNOSIS — L820 Inflamed seborrheic keratosis: Secondary | ICD-10-CM

## 2019-07-24 DIAGNOSIS — Z85828 Personal history of other malignant neoplasm of skin: Secondary | ICD-10-CM

## 2019-07-24 DIAGNOSIS — L821 Other seborrheic keratosis: Secondary | ICD-10-CM | POA: Diagnosis not present

## 2019-07-24 DIAGNOSIS — L578 Other skin changes due to chronic exposure to nonionizing radiation: Secondary | ICD-10-CM

## 2019-07-24 HISTORY — DX: Personal history of other malignant neoplasm of skin: Z85.828

## 2019-07-24 NOTE — Patient Instructions (Signed)

## 2019-07-24 NOTE — Progress Notes (Signed)
Follow-Up Visit   Subjective  Curtis Combs is a 59 y.o. male who presents for the following: Annual Exam (TBSE hx BCC, SCC, SCC IS, AKs) and check spots (face, R post auricular x 3-85m).  The following portions of the chart were reviewed this encounter and updated as appropriate: Problems      Review of Systems: No other skin or systemic complaints.  Objective  Well appearing patient in no apparent distress; mood and affect are within normal limits.  A full examination was performed including scalp, head, eyes, ears, nose, lips, neck, chest, axillae, abdomen, back, buttocks, bilateral upper extremities, bilateral lower extremities, hands, feet, fingers, toes, fingernails, and toenails. All findings within normal limits unless otherwise noted below.  Objective  hands x 22, face x 7 (29): Pink scaly macules  Objective  face, body: Actinic changes   Objective  L mid lat calf, L forearm, L dorsum hand (3): Clear, no evidence of recurrence   Objective  superior ant deltoid: Clear. No evidence of recurrence.   Objective  R med cheek infraorbital 1.5cm inf to lower eyelashes x 1, arms x 18, legs x 3 (22): Stuck on waxy paps with erythema   Objective  R scalp post auricular: 0.6cm pearly pap  BCC vs other  Objective  bil legs: Stuck on waxy tan papules   Assessment & Plan  AK (actinic keratosis) (29) hands x 22, face x 7  Destruction of lesion - hands x 22, face x 7 Complexity: simple   Destruction method: cryotherapy   Informed consent: discussed and consent obtained   Timeout:  patient name, date of birth, surgical site, and procedure verified Lesion destroyed using liquid nitrogen: Yes   Region frozen until ice ball extended beyond lesion: Yes   Outcome: patient tolerated procedure well with no complications   Post-procedure details: wound care instructions given    Actinic skin damage face, body  Recommend broad spectrum SPF and photoprotection    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) L mid lat calf, L forearm, L dorsum hand  Observe   History of basal cell carcinoma (BCC) superior ant deltoid  Observe   Inflamed seborrheic keratosis (22) R med cheek infraorbital 1.5cm inf to lower eyelashes x 1, arms x 18, legs x 3  Destruction of lesion - R med cheek infraorbital 1.5cm inf to lower eyelashes x 1, arms x 18, legs x 3 Complexity: simple   Destruction method: cryotherapy   Informed consent: discussed and consent obtained   Timeout:  patient name, date of birth, surgical site, and procedure verified Lesion destroyed using liquid nitrogen: Yes   Region frozen until ice ball extended beyond lesion: Yes   Outcome: patient tolerated procedure well with no complications   Post-procedure details: wound care instructions given    Neoplasm of skin R scalp post auricular  Epidermal / dermal shaving  Lesion length (cm):  0.6 Lesion width (cm):  0.6 Margin per side (cm):  0.2 Total excision diameter (cm):  1 Informed consent: discussed and consent obtained   Timeout: patient name, date of birth, surgical site, and procedure verified   Procedure prep:  Patient was prepped and draped in usual sterile fashion Prep type:  Isopropyl alcohol Anesthesia: the lesion was anesthetized in a standard fashion   Anesthetic:  1% lidocaine w/ epinephrine 1-100,000 buffered w/ 8.4% NaHCO3 Instrument used: flexible razor blade    Destruction of lesion Complexity: extensive   Destruction method: electrodesiccation and curettage  Informed consent: discussed and consent obtained   Curettage performed in three different directions: Yes   Electrodesiccation performed over the curetted area: Yes   Curettage cycles:  1 Lesion length (cm):  0.6 Lesion width (cm):  0.6 Margin per side (cm):  0.2 Final wound size (cm):  1 Hemostasis achieved with:  pressure, aluminum chloride and electrodesiccation Outcome: patient tolerated  procedure well with no complications   Post-procedure details: sterile dressing applied and wound care instructions given   Dressing type: bandage and petrolatum    Specimen 1 - Surgical pathology Differential Diagnosis: D48.5  BCC vs other Check Margins: No 0.6cm pearly pap   Seborrheic keratosis bil legs  Benign, observe.    Return in about 3 months (around 10/24/2019) for recheck ISK R med cheek infraorbital, AKs.   Tanja Port, MD, am acting as scribe for Sarina Ser, MD .

## 2019-07-29 ENCOUNTER — Telehealth: Payer: Self-pay

## 2019-07-29 NOTE — Telephone Encounter (Signed)
-----   Message from Ralene Bathe, MD sent at 07/29/2019  3:55 PM EDT ----- Cancer - BCC Already tx'd w [facility] on fu

## 2019-07-29 NOTE — Telephone Encounter (Signed)
Advised patient of results.  

## 2019-07-30 ENCOUNTER — Telehealth: Payer: Self-pay

## 2019-07-30 NOTE — Telephone Encounter (Signed)
-----   Message from Ralene Bathe, MD sent at 07/29/2019  3:55 PM EDT ----- Cancer - BCC Already tx'd w [facility] on fu

## 2019-07-30 NOTE — Telephone Encounter (Signed)
No answer in regards to pathology results.

## 2019-10-31 ENCOUNTER — Encounter: Payer: Self-pay | Admitting: Dermatology

## 2019-10-31 ENCOUNTER — Ambulatory Visit (INDEPENDENT_AMBULATORY_CARE_PROVIDER_SITE_OTHER): Payer: Managed Care, Other (non HMO) | Admitting: Dermatology

## 2019-10-31 ENCOUNTER — Other Ambulatory Visit: Payer: Self-pay

## 2019-10-31 DIAGNOSIS — L84 Corns and callosities: Secondary | ICD-10-CM

## 2019-10-31 DIAGNOSIS — L570 Actinic keratosis: Secondary | ICD-10-CM | POA: Diagnosis not present

## 2019-10-31 DIAGNOSIS — Z85828 Personal history of other malignant neoplasm of skin: Secondary | ICD-10-CM

## 2019-10-31 NOTE — Progress Notes (Signed)
   Follow-Up Visit   Subjective  Curtis Combs is a 59 y.o. male who presents for the following: Follow-up.  Patient presents today for follow up on OV 07/24/19 for ISK's (arms x 18, legs x 3, R med infraorbital) and AK's (hand x 22, face x 7) that were treated with cryotherapy, also states that he has a hard spot in the 4th and 5th toe web space and a red spot behind right ear.  The following portions of the chart were reviewed this encounter and updated as appropriate:  Tobacco  Allergies  Meds  Problems  Med Hx  Surg Hx  Fam Hx      Review of Systems:  No other skin or systemic complaints except as noted in HPI or Assessment and Plan.  Objective  Well appearing patient in no apparent distress; mood and affect are within normal limits.  A focused examination was performed including Face, right foot, and B/L arms. Relevant physical exam findings are noted in the Assessment and Plan.  Objective  Face and ears x 7 (7): Erythematous thin papules/macules with gritty scale.   Objective  Right 4th-5th Toe Web:  small circular hyperkeratotic macule   Objective  Right Postauricular Area: Well healed scar with no evidence of recurrence.    Assessment & Plan    AK (actinic keratosis) (7) Face and ears x 7  Cryotherapy today Prior to procedure, discussed risks of blister formation, small wound, skin dyspigmentation, or rare scar following cryotherapy.    Destruction of lesion - Face and ears x 7 Complexity: simple   Destruction method: cryotherapy   Informed consent: discussed and consent obtained   Timeout:  patient name, date of birth, surgical site, and procedure verified Lesion destroyed using liquid nitrogen: Yes   Region frozen until ice ball extended beyond lesion: Yes   Outcome: patient tolerated procedure well with no complications   Post-procedure details: wound care instructions given    Corns and callosities Right 4th-5th Toe Web  Recommend a  silicone toe separator to help with friction.  History of basal cell carcinoma (BCC) Right Postauricular Area  Clear. Observe for recurrence. Call clinic for new or changing lesions.  Recommend regular skin exams, daily broad-spectrum spf 30+ sunscreen use, and photoprotection.     Return in about 6 months (around 05/01/2020).  Marene Lenz, CMA, am acting as scribe for Sarina Ser, MD . Documentation: I have reviewed the above documentation for accuracy and completeness, and I agree with the above.  Sarina Ser, MD

## 2019-11-06 ENCOUNTER — Encounter: Payer: Self-pay | Admitting: Dermatology

## 2019-12-30 ENCOUNTER — Other Ambulatory Visit: Payer: Self-pay | Admitting: Physician Assistant

## 2019-12-30 DIAGNOSIS — I10 Essential (primary) hypertension: Secondary | ICD-10-CM

## 2020-01-24 ENCOUNTER — Encounter: Payer: Self-pay | Admitting: Physician Assistant

## 2020-01-24 ENCOUNTER — Ambulatory Visit: Payer: Managed Care, Other (non HMO) | Admitting: Physician Assistant

## 2020-01-24 ENCOUNTER — Other Ambulatory Visit: Payer: Self-pay

## 2020-01-24 VITALS — BP 120/82 | HR 80 | Temp 98.6°F | Ht 65.0 in | Wt 201.2 lb

## 2020-01-24 DIAGNOSIS — I10 Essential (primary) hypertension: Secondary | ICD-10-CM

## 2020-01-24 DIAGNOSIS — Z0000 Encounter for general adult medical examination without abnormal findings: Secondary | ICD-10-CM

## 2020-01-24 DIAGNOSIS — Z23 Encounter for immunization: Secondary | ICD-10-CM

## 2020-01-24 MED ORDER — AMLODIPINE BESYLATE 5 MG PO TABS: 5.0000 mg | ORAL_TABLET | Freq: Every day | ORAL | 3 refills | Status: DC

## 2020-01-24 NOTE — Patient Instructions (Signed)
Health Maintenance After Age 58 After age 58, you are at a higher risk for certain long-term diseases and infections as well as injuries from falls. Falls are a major cause of broken bones and head injuries in people who are older than age 58. Getting regular preventive care can help to keep you healthy and well. Preventive care includes getting regular testing and making lifestyle changes as recommended by your health care provider. Talk with your health care provider about:  Which screenings and tests you should have. A screening is a test that checks for a disease when you have no symptoms.  A diet and exercise plan that is right for you. What should I know about screenings and tests to prevent falls? Screening and testing are the best ways to find a health problem early. Early diagnosis and treatment give you the best chance of managing medical conditions that are common after age 58. Certain conditions and lifestyle choices may make you more likely to have a fall. Your health care provider may recommend:  Regular vision checks. Poor vision and conditions such as cataracts can make you more likely to have a fall. If you wear glasses, make sure to get your prescription updated if your vision changes.  Medicine review. Work with your health care provider to regularly review all of the medicines you are taking, including over-the-counter medicines. Ask your health care provider about any side effects that may make you more likely to have a fall. Tell your health care provider if any medicines that you take make you feel dizzy or sleepy.  Osteoporosis screening. Osteoporosis is a condition that causes the bones to get weaker. This can make the bones weak and cause them to break more easily.  Blood pressure screening. Blood pressure changes and medicines to control blood pressure can make you feel dizzy.  Strength and balance checks. Your health care provider may recommend certain tests to check your  strength and balance while standing, walking, or changing positions.  Foot health exam. Foot pain and numbness, as well as not wearing proper footwear, can make you more likely to have a fall.  Depression screening. You may be more likely to have a fall if you have a fear of falling, feel emotionally low, or feel unable to do activities that you used to do.  Alcohol use screening. Using too much alcohol can affect your balance and may make you more likely to have a fall. What actions can I take to lower my risk of falls? General instructions  Talk with your health care provider about your risks for falling. Tell your health care provider if: ? You fall. Be sure to tell your health care provider about all falls, even ones that seem minor. ? You feel dizzy, sleepy, or off-balance.  Take over-the-counter and prescription medicines only as told by your health care provider. These include any supplements.  Eat a healthy diet and maintain a healthy weight. A healthy diet includes low-fat dairy products, low-fat (lean) meats, and fiber from whole grains, beans, and lots of fruits and vegetables. Home safety  Remove any tripping hazards, such as rugs, cords, and clutter.  Install safety equipment such as grab bars in bathrooms and safety rails on stairs.  Keep rooms and walkways well-lit. Activity   Follow a regular exercise program to stay fit. This will help you maintain your balance. Ask your health care provider what types of exercise are appropriate for you.  If you need a cane or   walker, use it as recommended by your health care provider.  Wear supportive shoes that have nonskid soles. Lifestyle  Do not drink alcohol if your health care provider tells you not to drink.  If you drink alcohol, limit how much you have: ? 0-1 drink a day for women. ? 0-2 drinks a day for men.  Be aware of how much alcohol is in your drink. In the [place] one drink equals one typical bottle of beer (12  oz), one-half glass of wine (5 oz), or one shot of hard liquor (1 oz).  Do not use any products that contain nicotine or tobacco, such as cigarettes and e-cigarettes. If you need help quitting, ask your health care provider. Summary  Having a healthy lifestyle and getting preventive care can help to protect your health and wellness after age 58.  Screening and testing are the best way to find a health problem early and help you avoid having a fall. Early diagnosis and treatment give you the best chance for managing medical conditions that are more common for people who are older than age 58.  Falls are a major cause of broken bones and head injuries in people who are older than age 58. Take precautions to prevent a fall at home.  Work with your health care provider to learn what changes you can make to improve your health and wellness and to prevent falls. This information is not intended to replace advice given to you by your health care provider. Make sure you discuss any questions you have with your health care provider. Document Revised: 08/16/2018 Document Reviewed: 03/08/2017 Elsevier Patient Education  2020 [employer]vier Inc.  

## 2020-01-24 NOTE — Progress Notes (Signed)
Established patient visit   Patient: Curtis Combs   DOB: [birth year]   59 y.o. Male  MRN: [id number] Visit Date: 01/24/2020  Today's healthcare provider: Trinna Post, PA-C   Chief Complaint  Patient presents with  . Hypertension   Subjective    HPI   Patient presents today for CPE and HTN follow up. Patient is UTD on routine screening.   Hypertension, follow-up  BP Readings from Last 3 Encounters:  01/24/20 120/82  12/17/18 120/82  07/31/17 116/84   Wt Readings from Last 3 Encounters:  01/24/20 201 lb 3.2 oz (91.3 kg)  12/17/18 199 lb (90.3 kg)  07/31/17 199 lb (90.3 kg)     He was last seen for hypertension 1 years ago.  BP at that visit was 120/82. Management since that visit includes no changes.  He reports good compliance with treatment. He is not having side effects.  He is following a Regular diet. He is not exercising. He does not smoke.  Use of agents associated with hypertension: none.   Outside blood pressures are not being checked.  Pertinent labs: Lab Results  Component Value Date   CHOL 166 12/19/2018   HDL 53 12/19/2018   LDLCALC 101 (H) 12/19/2018   TRIG 60 12/19/2018   CHOLHDL 3.1 12/19/2018   Lab Results  Component Value Date   NA 138 12/19/2018   K 4.4 12/19/2018   CREATININE 1.10 12/19/2018   GFRNONAA 74 12/19/2018   GFRAA 86 12/19/2018   GLUCOSE 97 12/19/2018     The 10-year ASCVD risk score Mikey Bussing DC Jr., et al., 2013) is: 6%       Medications: Outpatient Medications Prior to Visit  Medication Sig  . aspirin EC 81 MG tablet Take 81 mg by mouth.  . valACYclovir (VALTREX) 1000 MG tablet Take 1,000 mg by mouth 2 (two) times daily.  . [DISCONTINUED] amLODipine (NORVASC) 5 MG tablet TAKE 1 TABLET BY MOUTH EVERY DAY   No facility-administered medications prior to visit.    Review of Systems  Constitutional: Negative.   Respiratory: Negative.   Cardiovascular: Negative.   Musculoskeletal: Negative.    Neurological: Negative.       Objective    BP 120/82 (BP Location: Right Arm)   Pulse 80   Temp 98.6 F (37 C)   Ht 5\' 5"  (1.651 m)   Wt 201 lb 3.2 oz (91.3 kg)   BMI 33.48 kg/m    Physical Exam Constitutional:      Appearance: Normal appearance.  Cardiovascular:     Rate and Rhythm: Normal rate and regular rhythm.     Heart sounds: Normal heart sounds.  Pulmonary:     Effort: Pulmonary effort is normal.     Breath sounds: Normal breath sounds.  Abdominal:     General: Bowel sounds are normal.     Palpations: Abdomen is soft.  Skin:    General: Skin is warm and dry.  Neurological:     Mental Status: He is alert and oriented to person, place, and time. Mental status is at baseline.  Psychiatric:        Mood and Affect: Mood normal.        Behavior: Behavior normal.       No results found for any visits on 01/24/20.  Assessment & Plan    1. Annual physical exam  - TSH - Lipid panel - Comprehensive metabolic panel - CBC with Differential/Platelet - PSA  2. Hypertension, unspecified type  Continue medication as below.   - amLODipine (NORVASC) 5 MG tablet; Take 1 tablet (5 mg total) by mouth daily.  Dispense: 90 tablet; Refill: 3  3. Need for influenza vaccination  - Flu Vaccine QUAD 6+ mos PF IM (Fluarix Quad PF)    No follow-ups on file.      ITrinna Post, PA-C, have reviewed all documentation for this visit. The documentation on 01/27/20 for the exam, diagnosis, procedures, and orders are all accurate and complete.  The entirety of the information documented in the History of Present Illness, Review of Systems and Physical Exam were personally obtained by me. Portions of this information were initially documented by [facility] and reviewed by me for thoroughness and accuracy.     Paulene Floor  [facility] [phone] (phone) [phone] (fax)  [place]

## 2020-02-06 LAB — CBC WITH DIFFERENTIAL/PLATELET
Basophils Absolute: 0.1 10*3/uL (ref 0.0–0.2)
Basos: 1 %
EOS (ABSOLUTE): 0.2 10*3/uL (ref 0.0–0.4)
Eos: 2 %
Hematocrit: 47.6 % (ref 37.5–51.0)
Hemoglobin: 16.4 g/dL (ref 13.0–17.7)
Immature Grans (Abs): 0 10*3/uL (ref 0.0–0.1)
Immature Granulocytes: 0 %
Lymphocytes Absolute: 3.1 10*3/uL (ref 0.7–3.1)
Lymphs: 38 %
MCH: 30.1 pg (ref 26.6–33.0)
MCHC: 34.5 g/dL (ref 31.5–35.7)
MCV: 88 fL (ref 79–97)
Monocytes Absolute: 0.7 10*3/uL (ref 0.1–0.9)
Monocytes: 8 %
Neutrophils Absolute: 4.2 10*3/uL (ref 1.4–7.0)
Neutrophils: 51 %
Platelets: 287 10*3/uL (ref 150–450)
RBC: 5.44 x10E6/uL (ref 4.14–5.80)
RDW: 13.1 % (ref 11.6–15.4)
WBC: 8.2 10*3/uL (ref 3.4–10.8)

## 2020-02-06 LAB — COMPREHENSIVE METABOLIC PANEL
ALT: 29 IU/L (ref 0–44)
AST: 31 IU/L (ref 0–40)
Albumin/Globulin Ratio: 1.7 (ref 1.2–2.2)
Albumin: 4.4 g/dL (ref 3.8–4.9)
Alkaline Phosphatase: 89 IU/L (ref 44–121)
BUN/Creatinine Ratio: 13 (ref 9–20)
BUN: 16 mg/dL (ref 6–24)
Bilirubin Total: 0.7 mg/dL (ref 0.0–1.2)
CO2: 20 mmol/L (ref 20–29)
Calcium: 9.5 mg/dL (ref 8.7–10.2)
Chloride: 104 mmol/L (ref 96–106)
Creatinine, Ser: 1.25 mg/dL (ref 0.76–1.27)
GFR calc Af Amer: 73 mL/min/{1.73_m2} (ref >59)
GFR calc non Af Amer: 63 mL/min/{1.73_m2} (ref >59)
Globulin, Total: 2.6 g/dL (ref 1.5–4.5)
Glucose: 95 mg/dL (ref 65–99)
Potassium: 4.5 mmol/L (ref 3.5–5.2)
Sodium: 140 mmol/L (ref 134–144)
Total Protein: 7 g/dL (ref 6.0–8.5)

## 2020-02-06 LAB — LIPID PANEL
Chol/HDL Ratio: 3.6 ratio (ref 0.0–5.0)
Cholesterol, Total: 186 mg/dL (ref 100–199)
HDL: 52 mg/dL (ref >39)
LDL Chol Calc (NIH): 122 mg/dL — ABNORMAL HIGH (ref 0–99)
Triglycerides: 66 mg/dL (ref 0–149)
VLDL Cholesterol Cal: 12 mg/dL (ref 5–40)

## 2020-02-06 LAB — TSH: TSH: 2.03 u[IU]/mL (ref 0.450–4.500)

## 2020-02-06 LAB — PSA: Prostate Specific Ag, Serum: 0.7 ng/mL (ref 0.0–4.0)

## 2020-05-14 ENCOUNTER — Ambulatory Visit (INDEPENDENT_AMBULATORY_CARE_PROVIDER_SITE_OTHER): Payer: Managed Care, Other (non HMO) | Admitting: Dermatology

## 2020-05-14 ENCOUNTER — Other Ambulatory Visit: Payer: Self-pay

## 2020-05-14 ENCOUNTER — Encounter: Payer: Self-pay | Admitting: Dermatology

## 2020-05-14 DIAGNOSIS — L570 Actinic keratosis: Secondary | ICD-10-CM

## 2020-05-14 DIAGNOSIS — L821 Other seborrheic keratosis: Secondary | ICD-10-CM

## 2020-05-14 DIAGNOSIS — Z85828 Personal history of other malignant neoplasm of skin: Secondary | ICD-10-CM

## 2020-05-14 DIAGNOSIS — L814 Other melanin hyperpigmentation: Secondary | ICD-10-CM | POA: Diagnosis not present

## 2020-05-14 DIAGNOSIS — Z1283 Encounter for screening for malignant neoplasm of skin: Secondary | ICD-10-CM

## 2020-05-14 DIAGNOSIS — D1800 Hemangioma unspecified site: Secondary | ICD-10-CM

## 2020-05-14 DIAGNOSIS — D229 Melanocytic nevi, unspecified: Secondary | ICD-10-CM

## 2020-05-14 DIAGNOSIS — L578 Other skin changes due to chronic exposure to nonionizing radiation: Secondary | ICD-10-CM

## 2020-05-14 NOTE — Patient Instructions (Signed)
Instructions for [facility] Medications  One or more of your medications was sent to the [facility] mail order compounding pharmacy. You will receive an email from them and can purchase the medicine through that link. It will then be mailed to your home at the address you confirmed. If for any reason you do not receive an email from them, please check your spam folder. If you still do not find the email, please let us know. [facility] phone number is [phone].   

## 2020-05-14 NOTE — Progress Notes (Signed)
Follow-Up Visit   Subjective  Curtis BeamMike Combs is a 60 y.o. male who presents for the following: Annual Exam (Total body skin exam, hx of BCCs, AKs) and check spots (face). The patient presents for Total-Body Skin Exam (TBSE) for skin cancer screening and mole check.  The following portions of the chart were reviewed this encounter and updated as appropriate:   Tobacco  Allergies  Meds  Problems  Med Hx  Surg Hx  Fam Hx     Review of Systems:  No other skin or systemic complaints except as noted in HPI or Assessment and Plan.  Objective  Well appearing patient in no apparent distress; mood and affect are within normal limits.  A full examination was performed including scalp, head, eyes, ears, nose, lips, neck, chest, axillae, abdomen, back, buttocks, bilateral upper extremities, bilateral lower extremities, hands, feet, fingers, toes, fingernails, and toenails. All findings within normal limits unless otherwise noted below.  Objective  L temple at lat brow x 1, R temple at hairline x 1, R mid cheek x 1 (3): Pink scaly macules   Assessment & Plan    Lentigines - Scattered tan macules - Discussed due to sun exposure - Benign, observe - Call for any changes  Seborrheic Keratoses - Stuck-on, waxy, tan-brown papules and plaques  - Discussed benign etiology and prognosis. - Observe - Call for any changes  Melanocytic Nevi - Tan-brown and/or pink-flesh-colored symmetric macules and papules - Benign appearing on exam today - Observation - Call clinic for new or changing moles - Recommend daily use of broad spectrum spf 30+ sunscreen to sun-exposed areas.   Hemangiomas - Red papules - Discussed benign nature - Observe - Call for any changes  Actinic Damage - Chronic, secondary to cumulative UV/sun exposure - diffuse scaly erythematous macules with underlying dyspigmentation - Recommend daily broad spectrum sunscreen SPF 30+ to sun-exposed areas, reapply every 2 hours  as needed.  - Call for new or changing lesions.  Skin cancer screening performed today.  History of Basal Cell Carcinoma and SCC of the Skin - No evidence of recurrence today - Recommend regular full body skin exams - Recommend daily broad spectrum sunscreen SPF 30+ to sun-exposed areas, reapply every 2 hours as needed.  - Call if any new or changing lesions are noted between office visits  AK (actinic keratosis) (3) L temple at lat brow x 1, R temple at hairline x 1, R mid cheek x 1   Start [facility] Fluorouracil: 5%/Calcipotriene: 0.005% bid for 7 days to face  Destruction of lesion - L temple at lat brow x 1, R temple at hairline x 1, R mid cheek x 1 Complexity: simple   Destruction method: cryotherapy   Informed consent: discussed and consent obtained   Timeout:  patient name, date of birth, surgical site, and procedure verified Lesion destroyed using liquid nitrogen: Yes   Region frozen until ice ball extended beyond lesion: Yes   Outcome: patient tolerated procedure well with no complications   Post-procedure details: wound care instructions given    Skin cancer screening   Severe, Confluent Chronic Actinic Changes with Pre-Cancerous Actinic Keratoses due to cumulative sun exposure/UV radiation exposure over time - Discussed Prescription "Field Treatment" Field treatment involves treatment of an entire area of skin that has confluent Actinic Changes (Sun/ Ultraviolet light damage) and PreCancerous Actinic Keratoses by method of PhotoDynamic Therapy (PDT) and/or prescription Topical Chemotherapy agents such as 5-fluorouracil, 5-fluorouracil/calcipotriene, and/or imiquimod.  The purpose is to  decrease the number of clinically evident and subclinical PreCancerous lesions to prevent progression to development of skin cancer by chemically destroying early precancer changes that may or may not be visible.  It has been shown to reduce the risk of developing skin cancer in the  treated area. As a result of treatment, redness, scaling, crusting, and open sores may occur during treatment course. One or more than one of these methods may be used and may have to be used several times to control, suppress and eliminate the PreCancerous changes. Discussed treatment course, expected reaction, and possible side effects.   Return in about 3 months (around 08/12/2020) for Ak f/u, recheck L temple at lat brow x 1, R temple at hairline x 1, R mid cheek x 1.  I, Ardis Rowan, RMA, am acting as scribe for Armida Sans, MD .  Documentation: I have reviewed the above documentation for accuracy and completeness, and I agree with the above.  Armida Sans, MD

## 2020-05-26 ENCOUNTER — Encounter: Payer: Self-pay | Admitting: Dermatology

## 2020-06-17 ENCOUNTER — Encounter: Payer: Self-pay | Admitting: Dermatology

## 2020-08-06 ENCOUNTER — Other Ambulatory Visit: Payer: Self-pay

## 2020-08-06 ENCOUNTER — Ambulatory Visit (INDEPENDENT_AMBULATORY_CARE_PROVIDER_SITE_OTHER): Payer: Managed Care, Other (non HMO) | Admitting: Dermatology

## 2020-08-06 DIAGNOSIS — L578 Other skin changes due to chronic exposure to nonionizing radiation: Secondary | ICD-10-CM

## 2020-08-06 DIAGNOSIS — L570 Actinic keratosis: Secondary | ICD-10-CM | POA: Diagnosis not present

## 2020-08-06 NOTE — Progress Notes (Signed)
   Follow-Up Visit   Subjective  Curtis Combs is a 60 y.o. male who presents for the following: Actinic Keratosis (Face - S/P [employer] Calcipotriene/5FU mix BID x 7 days. Patient states his face did get red, scaly, tight, and painful during treatment.).  The following portions of the chart were reviewed this encounter and updated as appropriate:   Tobacco  Allergies  Meds  Problems  Med Hx  Surg Hx  Fam Hx     Review of Systems:  No other skin or systemic complaints except as noted in HPI or Assessment and Plan.  Objective  Well appearing patient in no apparent distress; mood and affect are within normal limits.  A focused examination was performed including the face. Relevant physical exam findings are noted in the Assessment and Plan.  Objective  Face: Erythematous thin papules/macules with gritty scale.   Assessment & Plan  AK (actinic keratosis) Face  Restart 5FU/Calcipotriene mix BID x 7 days to the forehead.   Actinic Damage - Severe, confluent actinic changes with pre-cancerous actinic keratoses  - Severe, chronic, not at goal, secondary to cumulative UV radiation exposure over time - diffuse scaly erythematous macules and papules with underlying dyspigmentation - Discussed Prescription "Field Treatment" for Severe, Chronic Confluent Actinic Changes with Pre-Cancerous Actinic Keratoses Field treatment involves treatment of an entire area of skin that has confluent Actinic Changes (Sun/ Ultraviolet light damage) and PreCancerous Actinic Keratoses by method of PhotoDynamic Therapy (PDT) and/or prescription Topical Chemotherapy agents such as 5-fluorouracil, 5-fluorouracil/calcipotriene, and/or imiquimod.  The purpose is to decrease the number of clinically evident and subclinical PreCancerous lesions to prevent progression to development of skin cancer by chemically destroying early precancer changes that may or may not be visible.  It has been shown to reduce the  risk of developing skin cancer in the treated area. As a result of treatment, redness, scaling, crusting, and open sores may occur during treatment course. One or more than one of these methods may be used and may have to be used several times to control, suppress and eliminate the PreCancerous changes. Discussed treatment course, expected reaction, and possible side effects. - Recommend daily broad spectrum sunscreen SPF 30+ to sun-exposed areas, reapply every 2 hours as needed.  - Staying in the shade or wearing long sleeves, sun glasses (UVA+UVB protection) and wide brim hats (4-inch brim around the entire circumference of the hat) are also recommended. - Call for new or changing lesions. - Start 5FU/Calcipotriene mix BID x 7 days to the forehead.   Return in about 6 months (around 02/05/2021) for AK follow up .  Luther Redo, CMA, am acting as scribe for Sarina Ser, MD .  Documentation: I have reviewed the above documentation for accuracy and completeness, and I agree with the above.  Sarina Ser, MD

## 2020-08-06 NOTE — Patient Instructions (Signed)

## 2020-08-07 ENCOUNTER — Encounter: Payer: Self-pay | Admitting: Dermatology

## 2020-09-19 ENCOUNTER — Encounter: Payer: Self-pay | Admitting: Family Medicine

## 2020-09-19 DIAGNOSIS — Z1211 Encounter for screening for malignant neoplasm of colon: Secondary | ICD-10-CM

## 2020-09-21 NOTE — Telephone Encounter (Signed)
Does look like he is due for cologuard. Ok to order.

## 2020-10-12 LAB — COLOGUARD: Cologuard: NEGATIVE

## 2020-10-24 LAB — EXTERNAL GENERIC LAB PROCEDURE: COLOGUARD: NEGATIVE

## 2020-10-24 LAB — COLOGUARD: COLOGUARD: NEGATIVE

## 2020-10-26 ENCOUNTER — Telehealth: Payer: Self-pay

## 2020-10-26 NOTE — Telephone Encounter (Signed)
Ok. Can let patient know that it is negative. Recheck in 3 years.  It is not showing up in results review or health maintenance though. Very strange.

## 2020-10-26 NOTE — Telephone Encounter (Signed)
Cologuard result in chart for collection date 10/18/20.

## 2020-10-27 NOTE — Telephone Encounter (Signed)
Patient has been advised. KW 

## 2021-01-28 ENCOUNTER — Ambulatory Visit: Payer: 59 | Admitting: Dermatology

## 2021-01-28 ENCOUNTER — Ambulatory Visit: Payer: Managed Care, Other (non HMO) | Admitting: Dermatology

## 2021-01-28 ENCOUNTER — Other Ambulatory Visit: Payer: Self-pay

## 2021-01-28 DIAGNOSIS — L578 Other skin changes due to chronic exposure to nonionizing radiation: Secondary | ICD-10-CM | POA: Diagnosis not present

## 2021-01-28 DIAGNOSIS — L570 Actinic keratosis: Secondary | ICD-10-CM | POA: Diagnosis not present

## 2021-01-28 NOTE — Progress Notes (Signed)
Follow-Up Visit   Subjective  Curtis Combs is a 60 y.o. male who presents for the following: Follow-up (Patient here today for ak follow up at forehead. He denies any new concerns. ).  The following portions of the chart were reviewed this encounter and updated as appropriate:  Tobacco  Allergies  Meds  Problems  Med Hx  Surg Hx  Fam Hx     Review of Systems: No other skin or systemic complaints except as noted in HPI or Assessment and Plan.  Objective  Well appearing patient in no apparent distress; mood and affect are within normal limits.  A focused examination was performed including face. Relevant physical exam findings are noted in the Assessment and Plan.  right forehead x 1 Erythematous thin papules/macules with gritty scale.   Assessment & Plan  Actinic keratosis right forehead x 1  Actinic keratoses are precancerous spots that appear secondary to cumulative UV radiation exposure/sun exposure over time. They are chronic with expected duration over 1 year. A portion of actinic keratoses will progress to squamous cell carcinoma of the skin. It is not possible to reliably predict which spots will progress to skin cancer and so treatment is recommended to prevent development of skin cancer.  Recommend daily broad spectrum sunscreen SPF 30+ to sun-exposed areas, reapply every 2 hours as needed.  Recommend staying in the shade or wearing long sleeves, sun glasses (UVA+UVB protection) and wide brim hats (4-inch brim around the entire circumference of the hat). Call for new or changing lesions.  Recommend to  - ReStart 5-fluorouracil/calcipotriene cream twice a day for 4 - 7  days to affected areas including forehead.   Actinic Damage - Severe, confluent actinic changes with pre-cancerous actinic keratoses  - Severe, chronic, not at goal, secondary to cumulative UV radiation exposure over time - diffuse scaly erythematous macules and papules with underlying  dyspigmentation - Discussed Prescription "Field Treatment" for Severe, Chronic Confluent Actinic Changes with Pre-Cancerous Actinic Keratoses Field treatment involves treatment of an entire area of skin that has confluent Actinic Changes (Sun/ Ultraviolet light damage) and PreCancerous Actinic Keratoses by method of PhotoDynamic Therapy (PDT) and/or prescription Topical Chemotherapy agents such as 5-fluorouracil, 5-fluorouracil/calcipotriene, and/or imiquimod.  The purpose is to decrease the number of clinically evident and subclinical PreCancerous lesions to prevent progression to development of skin cancer by chemically destroying early precancer changes that may or may not be visible.  It has been shown to reduce the risk of developing skin cancer in the treated area. As a result of treatment, redness, scaling, crusting, and open sores may occur during treatment course. One or more than one of these methods may be used and may have to be used several times to control, suppress and eliminate the PreCancerous changes. Discussed treatment course, expected reaction, and possible side effects. - Recommend daily broad spectrum sunscreen SPF 30+ to sun-exposed areas, reapply every 2 hours as needed.  - Staying in the shade or wearing long sleeves, sun glasses (UVA+UVB protection) and wide brim hats (4-inch brim around the entire circumference of the hat) are also recommended. - Call for new or changing lesions.   Destruction of lesion - right forehead x 1 Complexity: simple   Destruction method: cryotherapy   Informed consent: discussed and consent obtained   Timeout:  patient name, date of birth, surgical site, and procedure verified Lesion destroyed using liquid nitrogen: Yes   Region frozen until ice ball extended beyond lesion: Yes   Outcome: patient tolerated procedure  well with no complications   Post-procedure details: wound care instructions given    Return in about 6 months (around 07/28/2021)  for ak followup. IRuthell Rummage, CMA, am acting as scribe for Sarina Ser, MD. Documentation: I have reviewed the above documentation for accuracy and completeness, and I agree with the above.  Sarina Ser, MD

## 2021-01-28 NOTE — Patient Instructions (Addendum)
5-Fluorouracil/Calcipotriene Patient Education   Restart at forehead use twice daily for 4 - 7 days.   Actinic keratoses are the dry, red scaly spots on the skin caused by sun damage. A portion of these spots can turn into skin cancer with time, and treating them can help prevent development of skin cancer.   Treatment of these spots requires removal of the defective skin cells. There are various ways to remove actinic keratoses, including freezing with liquid nitrogen, treatment with creams, or treatment with a blue light procedure in the office.   5-fluorouracil cream is a topical cream used to treat actinic keratoses. It works by interfering with the growth of abnormal fast-growing skin cells, such as actinic keratoses. These cells peel off and are replaced by healthy ones.   5-fluorouracil/calcipotriene is a combination of the 5-fluorouracil cream with a vitamin D analog cream called calcipotriene. The calcipotriene alone does not treat actinic keratoses. However, when it is combined with 5-fluorouracil, it helps the 5-fluorouracil treat the actinic keratoses much faster so that the same results can be achieved with a much shorter treatment time.  INSTRUCTIONS FOR 5-FLUOROURACIL/CALCIPOTRIENE CREAM:   5-fluorouracil/calcipotriene cream typically only needs to be used for 4-7 days. A thin layer should be applied twice a day to the treatment areas recommended by your physician.   If your physician prescribed you separate tubes of 5-fluourouracil and calcipotriene, apply a thin layer of 5-fluorouracil followed by a thin layer of calcipotriene.   Avoid contact with your eyes, nostrils, and mouth. Do not use 5-fluorouracil/calcipotriene cream on infected or open wounds.   You will develop redness, irritation and some crusting at areas where you have pre-cancer damage/actinic keratoses. IF YOU DEVELOP PAIN, BLEEDING, OR SIGNIFICANT CRUSTING, STOP THE TREATMENT EARLY - you have already gotten a good  response and the actinic keratoses should clear up well.  Wash your hands after applying 5-fluorouracil 5% cream on your skin.   A moisturizer or sunscreen with a minimum SPF 30 should be applied each morning.   Once you have finished the treatment, you can apply a thin layer of Vaseline twice a day to irritated areas to soothe and calm the areas more quickly. If you experience significant discomfort, contact your physician.  For some patients it is necessary to repeat the treatment for best results.  SIDE EFFECTS: When using 5-fluorouracil/calcipotriene cream, you may have mild irritation, such as redness, dryness, swelling, or a mild burning sensation. This usually resolves within 2 weeks. The more actinic keratoses you have, the more redness and inflammation you can expect during treatment. Eye irritation has been reported rarely. If this occurs, please let us know.  If you have any trouble using this cream, please call the office. If you have any other questions about this information, please do not hesitate to ask me before you leave the office.  If you have any questions or concerns for your doctor, please call our main line at [phone] and press option 4 to reach your doctor's medical assistant. If no one answers, please leave a voicemail as directed and we will return your call as soon as possible. Messages left after 4 pm will be answered the following business day.   You may also send [place] a message via [place]. We typically respond to MyChart messages within 1-2 business days.  For prescription refills, please ask your pharmacy to contact our office. Our fax number is [phone].  If you have an urgent issue when the clinic is closed that  cannot wait until the next business day, you can page your doctor at the number below.    Please note that while we do our best to be available for urgent issues outside of office hours, we are not available 24/7.   If you have an urgent issue  and are unable to reach [place], you may choose to seek medical care at your doctor's office, retail clinic, urgent care center, or emergency room.  If you have a medical emergency, please immediately call 911 or go to the emergency department.  Pager Numbers  - Dr. Nehemiah Massed: [phone]  - Dr. Laurence Ferrari: [phone]  - Dr. Nicole Kindred: [phone]  In the event of inclement weather, please call our main line at [phone] for an update on the status of any delays or closures.  Dermatology Medication Tips: Please keep the boxes that topical medications come in in order to help keep track of the instructions about where and how to use these. Pharmacies typically print the medication instructions only on the boxes and not directly on the medication tubes.   If your medication is too expensive, please contact our office at [phone] option 4 or send [place] a message through [place].   We are unable to tell what your co-pay for medications will be in advance as this is different depending on your insurance coverage. However, we may be able to find a substitute medication at lower cost or fill out paperwork to get insurance to cover a needed medication.   If a prior authorization is required to get your medication covered by your insurance company, please allow [place] 1-2 business days to complete this process.  Drug prices often vary depending on where the prescription is filled and some pharmacies may offer cheaper prices.  The website www.goodrx.com contains coupons for medications through different pharmacies. The prices here do not account for what the cost may be with help from insurance (it may be cheaper with your insurance), but the website can give you the price if you did not use any insurance.  - You can print the associated coupon and take it with your prescription to the pharmacy.  - You may also stop by our office during regular business hours and pick up a GoodRx coupon card.  - If you need  your prescription sent electronically to a different pharmacy, notify our office through [facility] or by phone at [phone] option 4.

## 2021-02-01 ENCOUNTER — Encounter: Payer: Self-pay | Admitting: Dermatology

## 2021-02-01 ENCOUNTER — Encounter: Payer: Self-pay | Admitting: Family Medicine

## 2021-02-01 ENCOUNTER — Other Ambulatory Visit: Payer: Self-pay

## 2021-02-01 ENCOUNTER — Ambulatory Visit: Payer: 59 | Admitting: Family Medicine

## 2021-02-01 VITALS — BP 124/88 | HR 81 | Temp 98.3°F | Resp 16 | Ht 65.0 in | Wt 204.2 lb

## 2021-02-01 DIAGNOSIS — E782 Mixed hyperlipidemia: Secondary | ICD-10-CM | POA: Insufficient documentation

## 2021-02-01 DIAGNOSIS — I10 Essential (primary) hypertension: Secondary | ICD-10-CM

## 2021-02-01 DIAGNOSIS — Z6833 Body mass index (BMI) 33.0-33.9, adult: Secondary | ICD-10-CM

## 2021-02-01 DIAGNOSIS — Z23 Encounter for immunization: Secondary | ICD-10-CM | POA: Diagnosis not present

## 2021-02-01 DIAGNOSIS — E669 Obesity, unspecified: Secondary | ICD-10-CM | POA: Insufficient documentation

## 2021-02-01 DIAGNOSIS — Z125 Encounter for screening for malignant neoplasm of prostate: Secondary | ICD-10-CM

## 2021-02-01 MED ORDER — AMLODIPINE BESYLATE 5 MG PO TABS: 5.0000 mg | ORAL_TABLET | Freq: Every day | ORAL | 3 refills | Status: DC

## 2021-02-01 NOTE — Progress Notes (Signed)
Established patient visit   Patient: Curtis Combs   DOB: 31-Jan-1961   60 y.o. Male  MRN: [id number] Visit Date: 02/01/2021  Today's healthcare provider: Lavon Paganini, MD   Chief Complaint  Patient presents with   Hypertension   Subjective    Hypertension Pertinent negatives include no chest pain, palpitations or shortness of breath.   Hypertension, follow-up  BP Readings from Last 3 Encounters:  02/01/21 124/88  01/24/20 120/82  12/17/18 120/82   Wt Readings from Last 3 Encounters:  02/01/21 204 lb 3.2 oz (92.6 kg)  01/24/20 201 lb 3.2 oz (91.3 kg)  12/17/18 199 lb (90.3 kg)     He was last seen for hypertension 1 years ago.  BP at that visit was 120/82. Management since that visit includes no changes. He reports excellent compliance with treatment. He is not having side effects.  He is following a Regular diet. He is not exercising. He does not smoke.  Use of agents associated with hypertension: none.   Outside blood pressures are not being checked. Symptoms: No chest pain No chest pressure  No palpitations No syncope  No dyspnea No orthopnea  No paroxysmal nocturnal dyspnea No lower extremity edema   Pertinent labs: Lab Results  Component Value Date   CHOL 186 02/05/2020   HDL 52 02/05/2020   LDLCALC 122 (H) 02/05/2020   TRIG 66 02/05/2020   CHOLHDL 3.6 02/05/2020   Lab Results  Component Value Date   NA 140 02/05/2020   K 4.5 02/05/2020   CREATININE 1.25 02/05/2020   GFRNONAA 63 02/05/2020   GLUCOSE 95 02/05/2020     The 10-year ASCVD risk score (Arnett DK, et al., 2019) is: 7.9%   ---------------------------------------------------------------------------------------------------     Medications: Outpatient Medications Prior to Visit  Medication Sig   valACYclovir (VALTREX) 1000 MG tablet Take 1,000 mg by mouth 2 (two) times daily.   [DISCONTINUED] amLODipine (NORVASC) 5 MG tablet Take 1 tablet (5 mg total) by mouth  daily.   [DISCONTINUED] aspirin EC 81 MG tablet Take 81 mg by mouth.   No facility-administered medications prior to visit.    Review of Systems  Constitutional:  Negative for activity change, appetite change and fatigue.  Respiratory:  Negative for chest tightness and shortness of breath.   Cardiovascular:  Negative for chest pain and palpitations.       Objective    BP 124/88 (BP Location: Left Arm, Patient Position: Sitting, Cuff Size: Large)   Pulse 81   Temp 98.3 F (36.8 C) (Oral)   Resp 16   Ht 5\' 5"  (1.651 m)   Wt 204 lb 3.2 oz (92.6 kg)   BMI 33.98 kg/m  BP Readings from Last 3 Encounters:  02/01/21 124/88  01/24/20 120/82  12/17/18 120/82   Wt Readings from Last 3 Encounters:  02/01/21 204 lb 3.2 oz (92.6 kg)  01/24/20 201 lb 3.2 oz (91.3 kg)  12/17/18 199 lb (90.3 kg)      Physical Exam Vitals reviewed.  Constitutional:      General: He is not in acute distress.    Appearance: Normal appearance. He is not diaphoretic.  HENT:     Head: Normocephalic and atraumatic.  Eyes:     General: No scleral icterus.    Conjunctiva/sclera: Conjunctivae normal.  Cardiovascular:     Rate and Rhythm: Normal rate and regular rhythm.     Pulses: Normal pulses.     Heart sounds: Normal heart sounds. No  murmur heard. Pulmonary:     Effort: Pulmonary effort is normal. No respiratory distress.     Breath sounds: Normal breath sounds. No wheezing or rhonchi.  Abdominal:     General: There is no distension.     Palpations: Abdomen is soft.     Tenderness: There is no abdominal tenderness.  Musculoskeletal:     Cervical back: Neck supple.     Right lower leg: No edema.     Left lower leg: No edema.  Lymphadenopathy:     Cervical: No cervical adenopathy.  Skin:    General: Skin is warm and dry.     Capillary Refill: Capillary refill takes less than 2 seconds.     Findings: No rash.  Neurological:     Mental Status: He is alert and oriented to person, place, and  time.     Cranial Nerves: No cranial nerve deficit.  Psychiatric:        Mood and Affect: Mood normal.        Behavior: Behavior normal.      No results found for any visits on 02/01/21.  Assessment & Plan     Problem List Items Addressed This Visit       Cardiovascular and Mediastinum   Hypertension - Primary    Well controlled Continue current medications Recheck metabolic panel F/u in 6 months       Relevant Medications   amLODipine (NORVASC) 5 MG tablet   Other Relevant Orders   Comprehensive metabolic panel     Other   Moderate mixed hyperlipidemia not requiring statin therapy    Reviewed last lipid panel Not currently on a statin Recheck FLP and CMP Discussed diet and exercise  Discussed lack of evidence for aspirin for primary prevention      Relevant Medications   amLODipine (NORVASC) 5 MG tablet   Other Relevant Orders   Comprehensive metabolic panel   Lipid panel   Obesity    Discussed importance of healthy weight management Discussed diet and exercise       Other Visit Diagnoses     Need for influenza vaccination       Relevant Orders   Flu Vaccine QUAD 81mo+IM (Fluarix, Fluzone & Alfiuria Quad PF) (Completed)   Prostate cancer screening       Relevant Orders   PSA Total (Reflex To Free)        Return in about 6 months (around 08/01/2021) for CPE, With new PCP.      I, Lavon Paganini, MD, have reviewed all documentation for this visit. The documentation on 02/01/21 for the exam, diagnosis, procedures, and orders are all accurate and complete.   Pamla Pangle, Dionne Bucy, MD, MPH [facility]

## 2021-02-01 NOTE — Assessment & Plan Note (Signed)
Discussed importance of healthy weight management Discussed diet and exercise  

## 2021-02-01 NOTE — Assessment & Plan Note (Addendum)
Reviewed last lipid panel Not currently on a statin Recheck FLP and CMP Discussed diet and exercise  Discussed lack of evidence for aspirin for primary prevention

## 2021-02-01 NOTE — Assessment & Plan Note (Signed)
Well controlled Continue current medications Recheck metabolic panel F/u in 6 months  

## 2021-02-12 LAB — COMPREHENSIVE METABOLIC PANEL
ALT: 36 IU/L (ref 0–44)
AST: 36 IU/L (ref 0–40)
Albumin/Globulin Ratio: 1.7 (ref 1.2–2.2)
Albumin: 4.6 g/dL (ref 3.8–4.9)
Alkaline Phosphatase: 79 IU/L (ref 44–121)
BUN/Creatinine Ratio: 11 (ref 9–20)
BUN: 14 mg/dL (ref 6–24)
Bilirubin Total: 0.6 mg/dL (ref 0.0–1.2)
CO2: 21 mmol/L (ref 20–29)
Calcium: 9.8 mg/dL (ref 8.7–10.2)
Chloride: 102 mmol/L (ref 96–106)
Creatinine, Ser: 1.25 mg/dL (ref 0.76–1.27)
Globulin, Total: 2.7 g/dL (ref 1.5–4.5)
Glucose: 104 mg/dL — ABNORMAL HIGH (ref 70–99)
Potassium: 4.8 mmol/L (ref 3.5–5.2)
Sodium: 142 mmol/L (ref 134–144)
Total Protein: 7.3 g/dL (ref 6.0–8.5)
eGFR: 66 mL/min/{1.73_m2} (ref >59)

## 2021-02-12 LAB — LIPID PANEL
Chol/HDL Ratio: 3.3 ratio (ref 0.0–5.0)
Cholesterol, Total: 184 mg/dL (ref 100–199)
HDL: 55 mg/dL
LDL Chol Calc (NIH): 116 mg/dL — ABNORMAL HIGH (ref 0–99)
Triglycerides: 70 mg/dL (ref 0–149)
VLDL Cholesterol Cal: 13 mg/dL (ref 5–40)

## 2021-02-12 LAB — PSA TOTAL (REFLEX TO FREE): Prostate Specific Ag, Serum: 0.8 ng/mL (ref 0.0–4.0)

## 2021-07-29 ENCOUNTER — Ambulatory Visit: Payer: 59 | Admitting: Dermatology

## 2021-07-29 ENCOUNTER — Other Ambulatory Visit: Payer: Self-pay

## 2021-07-29 DIAGNOSIS — L820 Inflamed seborrheic keratosis: Secondary | ICD-10-CM

## 2021-07-29 DIAGNOSIS — L578 Other skin changes due to chronic exposure to nonionizing radiation: Secondary | ICD-10-CM

## 2021-07-29 DIAGNOSIS — L570 Actinic keratosis: Secondary | ICD-10-CM | POA: Diagnosis not present

## 2021-07-29 NOTE — Patient Instructions (Signed)

## 2021-07-29 NOTE — Progress Notes (Signed)
? ?Follow-Up Visit ?  ?Subjective  ?Curtis Combs is a 61 y.o. male who presents for the following: Actinic Keratosis (S/P 5FU/Calcipotriene cream BID x 7 days to the forehead and temples. Patient did have a reaction but not as significant as the time before when he used it.). ?The patient has spots, moles and lesions to be evaluated, some may be new or changing and the patient has concerns that these could be cancer. ? ?The following portions of the chart were reviewed this encounter and updated as appropriate:  ? Tobacco  Allergies  Meds  Problems  Med Hx  Surg Hx  Fam Hx   ?  ?Review of Systems:  No other skin or systemic complaints except as noted in HPI or Assessment and Plan. ? ?Objective  ?Well appearing patient in no apparent distress; mood and affect are within normal limits. ? ?A focused examination was performed including the face. Relevant physical exam findings are noted in the Assessment and Plan. ? ?R ear x 2 (2) ?Erythematous thin papules/macules with gritty scale.  ? ?R mastoid x 1 ?Erythematous stuck-on, waxy papule or plaque ? ? ?Assessment & Plan  ?AK (actinic keratosis) (2) ?R ear x 2 ? ?Destruction of lesion - R ear x 2 ?Complexity: simple   ?Destruction method: cryotherapy   ?Informed consent: discussed and consent obtained   ?Timeout:  patient name, date of birth, surgical site, and procedure verified ?Lesion destroyed using liquid nitrogen: Yes   ?Region frozen until ice ball extended beyond lesion: Yes   ?Outcome: patient tolerated procedure well with no complications   ?Post-procedure details: wound care instructions given   ? ?Inflamed seborrheic keratosis ?R mastoid x 1 ? ?Destruction of lesion - R mastoid x 1 ?Complexity: simple   ?Destruction method: cryotherapy   ?Informed consent: discussed and consent obtained   ?Timeout:  patient name, date of birth, surgical site, and procedure verified ?Lesion destroyed using liquid nitrogen: Yes   ?Region frozen until ice ball extended  beyond lesion: Yes   ?Outcome: patient tolerated procedure well with no complications   ?Post-procedure details: wound care instructions given   ? ?Actinic Damage - Severe, confluent actinic changes with pre-cancerous actinic keratoses  ?- Severe, chronic, not at goal, secondary to cumulative UV radiation exposure over time ?- diffuse scaly erythematous macules and papules with underlying dyspigmentation ?- Discussed Prescription "Field Treatment" for Severe, Chronic Confluent Actinic Changes with Pre-Cancerous Actinic Keratoses ?Field treatment involves treatment of an entire area of skin that has confluent Actinic Changes (Sun/ Ultraviolet light damage) and PreCancerous Actinic Keratoses by method of PhotoDynamic Therapy (PDT) and/or prescription Topical Chemotherapy agents such as 5-fluorouracil, 5-fluorouracil/calcipotriene, and/or imiquimod.  The purpose is to decrease the number of clinically evident and subclinical PreCancerous lesions to prevent progression to development of skin cancer by chemically destroying early precancer changes that may or may not be visible.  It has been shown to reduce the risk of developing skin cancer in the treated area. As a result of treatment, redness, scaling, crusting, and open sores may occur during treatment course. One or more than one of these methods may be used and may have to be used several times to control, suppress and eliminate the PreCancerous changes. Discussed treatment course, expected reaction, and possible side effects. ?- Recommend daily broad spectrum sunscreen SPF 30+ to sun-exposed areas, reapply every 2 hours as needed.  ?- Staying in the shade or wearing long sleeves, sun glasses (UVA+UVB protection) and wide brim hats (4-inch brim  around the entire circumference of the hat) are also recommended. ?- Call for new or changing lesions. ?- Start 5FU/Calcipotriene mix BID x 7 days to the hands.  ? ?Return in about 6 months (around 01/29/2022). ? ?I, Rudell Cobb, CMA, am acting as scribe for Sarina Ser, MD . ?Documentation: I have reviewed the above documentation for accuracy and completeness, and I agree with the above. ? ?Sarina Ser, MD ? ? ?

## 2021-08-01 ENCOUNTER — Encounter: Payer: Self-pay | Admitting: Dermatology

## 2021-08-03 ENCOUNTER — Ambulatory Visit (INDEPENDENT_AMBULATORY_CARE_PROVIDER_SITE_OTHER): Payer: 59 | Admitting: Family Medicine

## 2021-08-03 ENCOUNTER — Encounter: Payer: Self-pay | Admitting: Family Medicine

## 2021-08-03 ENCOUNTER — Other Ambulatory Visit: Payer: Self-pay

## 2021-08-03 VITALS — BP 136/90 | HR 80 | Temp 98.5°F | Resp 16 | Ht 65.0 in | Wt 206.2 lb

## 2021-08-03 DIAGNOSIS — Z125 Encounter for screening for malignant neoplasm of prostate: Secondary | ICD-10-CM

## 2021-08-03 DIAGNOSIS — R739 Hyperglycemia, unspecified: Secondary | ICD-10-CM

## 2021-08-03 DIAGNOSIS — Z0000 Encounter for general adult medical examination without abnormal findings: Secondary | ICD-10-CM | POA: Diagnosis not present

## 2021-08-03 DIAGNOSIS — E782 Mixed hyperlipidemia: Secondary | ICD-10-CM | POA: Diagnosis not present

## 2021-08-03 DIAGNOSIS — B009 Herpesviral infection, unspecified: Secondary | ICD-10-CM

## 2021-08-03 DIAGNOSIS — I10 Essential (primary) hypertension: Secondary | ICD-10-CM | POA: Diagnosis not present

## 2021-08-03 DIAGNOSIS — J301 Allergic rhinitis due to pollen: Secondary | ICD-10-CM | POA: Insufficient documentation

## 2021-08-03 MED ORDER — CETIRIZINE HCL 10 MG PO TABS: 10.0000 mg | ORAL_TABLET | Freq: Every day | ORAL | 11 refills | Status: DC

## 2021-08-03 MED ORDER — VALACYCLOVIR HCL 1 G PO TABS: 1000.0000 mg | ORAL_TABLET | Freq: Two times a day (BID) | ORAL | 3 refills | Status: DC

## 2021-08-03 MED ORDER — FLUTICASONE PROPIONATE 50 MCG/ACT NA SUSP: 2.0000 | Freq: Every day | NASAL | 6 refills | Status: DC

## 2021-08-03 NOTE — Assessment & Plan Note (Signed)
Denies change in bladder habits. Defer DRE. Will get PSA today.  ?

## 2021-08-03 NOTE — Assessment & Plan Note (Signed)
Chronic, elevated today 136/90; however, pt with generalized complaint of respiratory illness ?Denies CP ?Denies SOB/ DOE ?Denies low blood pressure/hypotension ?Denies vision changes ?No LE Edema noted on exam ?Continue medication, 5 mg Norvasc ?Denies side effects ?RTC in 1 month for HTN follow up and lab review; discuss if need to increase Rx ?Seek emergent care if you develop chest pain or chest pressure ? ?

## 2021-08-03 NOTE — Progress Notes (Signed)
? ?I,Curtis Combs,acting as a scribe for Curtis Sprout, FNP.,have documented all relevant documentation on the behalf of Curtis Sprout, FNP,as directed by  Curtis Sprout, FNP while in the presence of Curtis Sprout, FNP.  ? ? ?Complete physical exam ? ? ?Patient: Curtis Combs   DOB: 01/13/1961   61 y.o. Male  MRN: [id number] ?Visit Date: 08/03/2021 ? ?Today's healthcare provider: Gwyneth Sprout, FNP  ?Introduced to Designer, jewellery role and practice setting.  All questions answered.  Discussed provider/patient relationship and expectations. ? ? ?Chief Complaint  ?Patient presents with  ? Annual Exam  ? ?Subjective  ?  ?HPI  ?Curtis Combs is a 61 y.o. male who presents today for a complete physical exam.  ?He reports consuming a general diet. Home exercise routine includes treadmill and weights. He generally feels well. He reports sleeping well. He does have additional problems to discuss today.  ? ?Past Medical History:  ?Diagnosis Date  ? Actinic keratosis 11/19/2018  ? left mid lat calf superior  ? Basal cell carcinoma 03/10/2008  ? sup ant deltoid  ? History of basal cell carcinoma (BCC) 07/24/2019  ? right scalp postauricular  ? Squamous cell carcinoma in situ   ? left forearm dorsum near elbow, left dorsum hand  ? 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 left dorsum hand : squamous cell carcinoma in situ hypertrophic  ? Squamous cell carcinoma of skin 11/19/2018  ? left mid lateral calf inf  ? ?Past Surgical History:  ?Procedure Laterality Date  ? APPENDECTOMY    ? [place]  ? ?Social History  ? ?Socioeconomic History  ? Marital status: Married  ?  Spouse name: Not on file  ? Number of children: Not on file  ? Years of education: Not on file  ? Highest education level: Not on file  ?Occupational History  ? Not on file  ?Tobacco Use  ? Smoking status: Never  ? Smokeless tobacco: Never  ?Vaping Use  ? Vaping Use: Never used  ?Substance and Sexual Activity  ? Alcohol use:  No  ? Drug use: No  ? Sexual activity: Not on file  ?Other Topics Concern  ? Not on file  ?Social History Narrative  ? Not on file  ? ?Social Determinants of Health  ? ?Financial Resource Strain: Not on file  ?Food Insecurity: Not on file  ?Transportation Needs: Not on file  ?Physical Activity: Not on file  ?Stress: Not on file  ?Social Connections: Not on file  ?Intimate Partner Violence: Not on file  ? ?Family Status  ?Relation Name Status  ? Mother  Alive  ? Father  Alive  ? Brother  Alive  ? MGM  Deceased  ? Mat Aunt  (Not Specified)  ? ?Family History  ?Problem Relation Age of Onset  ? Cirrhosis Mother   ? Diabetes Mother   ? Diverticulitis Mother   ? Healthy Brother   ? Colon cancer Maternal Grandmother   ? Cirrhosis Maternal Aunt   ? ?No Known Allergies  ?Patient Care Team: ?Curtis Sprout, FNP as PCP - General (Family Medicine)  ? ?Medications: ?Outpatient Medications Prior to Visit  ?Medication Sig  ? amLODipine (NORVASC) 5 MG tablet Take 1 tablet (5 mg total) by mouth daily.  ? [DISCONTINUED] valACYclovir (VALTREX) 1000 MG tablet Take 1,000 mg by mouth 2 (two) times daily.  ? ?No facility-administered medications prior to visit.  ? ? ?Review of Systems  ?  Constitutional:  Positive for fatigue.  ?HENT:  Positive for congestion and sore throat.   ?Eyes: Negative.   ?Respiratory:  Positive for cough, chest tightness and shortness of breath.   ?Cardiovascular: Negative.   ?Gastrointestinal: Negative.   ?Endocrine: Negative.   ?Genitourinary: Negative.   ?Musculoskeletal: Negative.   ?Skin: Negative.   ?Allergic/Immunologic: Negative.   ?Neurological: Negative.   ?Hematological: Negative.   ?Psychiatric/Behavioral: Negative.    ? ? ? Objective  ?  ?BP 136/90 (BP Location: Left Arm, Patient Position: Sitting, Cuff Size: Large)   Pulse 80   Temp 98.5 ?F (36.9 ?C) (Oral)   Resp 16   Ht '5\' 5"'$  (1.651 m)   Wt 206 lb 3.2 oz (93.5 kg)   BMI 34.31 kg/m?  ? ? ? ?Physical Exam ?Vitals and nursing note reviewed.   ?Constitutional:   ?   General: He is awake. He is not in acute distress. ?   Appearance: Normal appearance. He is well-developed and well-groomed. He is obese. He is not ill-appearing, toxic-appearing or diaphoretic.  ?HENT:  ?   Head: Normocephalic and atraumatic.  ?   Jaw: There is normal jaw occlusion. No trismus, tenderness, swelling or pain on movement.  ?   Salivary Glands: Right salivary gland is not diffusely enlarged or tender. Left salivary gland is not diffusely enlarged or tender.  ?   Right Ear: Hearing, tympanic membrane, ear canal and external ear normal. There is no impacted cerumen.  ?   Left Ear: Hearing, tympanic membrane, ear canal and external ear normal. There is no impacted cerumen.  ?   Nose: Nose normal. No congestion or rhinorrhea.  ?   Right Turbinates: Not enlarged, swollen or pale.  ?   Left Turbinates: Not enlarged, swollen or pale.  ?   Right Sinus: No maxillary sinus tenderness or frontal sinus tenderness.  ?   Left Sinus: No maxillary sinus tenderness or frontal sinus tenderness.  ?   Mouth/Throat:  ?   Lips: Pink.  ?   Mouth: Mucous membranes are moist. No injury, lacerations, oral lesions or angioedema.  ?   Pharynx: Oropharynx is clear. Uvula midline. No pharyngeal swelling, oropharyngeal exudate or posterior oropharyngeal erythema.  ?   Tonsils: No tonsillar exudate or tonsillar abscesses.  ?Eyes:  ?   General: Lids are normal. Vision grossly intact. Gaze aligned appropriately.     ?   Right eye: No discharge.     ?   Left eye: No discharge.  ?   Extraocular Movements: Extraocular movements intact.  ?   Conjunctiva/sclera: Conjunctivae normal.  ?   Pupils: Pupils are equal, round, and reactive to light.  ?Neck:  ?   Thyroid: No thyroid mass, thyromegaly or thyroid tenderness.  ?   Vascular: No carotid bruit.  ?   Trachea: Trachea normal. No tracheal tenderness.  ?Cardiovascular:  ?   Rate and Rhythm: Normal rate and regular rhythm.  ?   Pulses: Normal pulses.     ?     Carotid  pulses are 2+ on the right side and 2+ on the left side. ?     Radial pulses are 2+ on the right side and 2+ on the left side.  ?     Femoral pulses are 2+ on the right side and 2+ on the left side. ?     Popliteal pulses are 2+ on the right side and 2+ on the left side.  ?     Dorsalis pedis  pulses are 2+ on the right side and 2+ on the left side.  ?     Posterior tibial pulses are 2+ on the right side and 2+ on the left side.  ?   Heart sounds: Normal heart sounds, S1 normal and S2 normal. No murmur heard. ?  No friction rub. No gallop.  ?Pulmonary:  ?   Effort: Pulmonary effort is normal. No respiratory distress.  ?   Breath sounds: Normal breath sounds and air entry. No stridor. No wheezing, rhonchi or rales.  ?Chest:  ?   Chest wall: No tenderness.  ?Abdominal:  ?   General: Abdomen is flat. Bowel sounds are normal. There is no distension.  ?   Palpations: Abdomen is soft. There is no mass.  ?   Tenderness: There is no abdominal tenderness. There is no guarding or rebound.  ?   Hernia: No hernia is present.  ?Genitourinary: ?   Comments: Exam deferred; denies complaints ?Musculoskeletal:     ?   General: No swelling, tenderness, deformity or signs of injury. Normal range of motion.  ?   Cervical back: Normal range of motion and neck supple. No rigidity or tenderness.  ?   Right lower leg: No edema.  ?   Left lower leg: No edema.  ?Lymphadenopathy:  ?   Cervical: No cervical adenopathy.  ?   Right cervical: No superficial, deep or posterior cervical adenopathy. ?   Left cervical: No superficial, deep or posterior cervical adenopathy.  ?Skin: ?   General: Skin is warm and dry.  ?   Capillary Refill: Capillary refill takes less than 2 seconds.  ?   Coloration: Skin is not jaundiced or pale.  ?   Findings: No bruising, erythema, lesion or rash.  ?Neurological:  ?   General: No focal deficit present.  ?   Mental Status: He is alert and oriented to person, place, and time. Mental status is at baseline.  ?   GCS: GCS  eye subscore is 4. GCS verbal subscore is 5. GCS motor subscore is 6.  ?   Sensory: Sensation is intact. No sensory deficit.  ?   Motor: Motor function is intact. No weakness.  ?   Coordination: Coordination is

## 2021-08-03 NOTE — Assessment & Plan Note (Signed)
Chronic, stable ?Reports 3 episodes of fever blisters per year ?Continues use of antivirals to assist with reduced time of infection ?

## 2021-08-03 NOTE — Assessment & Plan Note (Signed)
UTD on dental ?UTD on vision ?UTD on derm ?Things to do to keep yourself healthy  ?- Exercise at least 30-45 minutes a day, 3-4 days a week.  ?- Eat a low-fat diet with lots of fruits and vegetables, up to 7-9 servings per day.  ?- Seatbelts can save your life. Wear them always.  ?- Smoke detectors on every level of your home, check batteries every year.  ?- Eye Doctor - have an eye exam every 1-2 years  ?- Safe sex - if you may be exposed to STDs, use a condom.  ?- Alcohol -  If you drink, do it moderately, less than 2 drinks per day.  ?- [place]. Choose someone to speak for you if you are not able.  ?- Depression is common in our stressful world.If you're feeling down or losing interest in things you normally enjoy, please come in for a visit.  ?- Violence - If anyone is threatening or hurting you, please call immediately. ? ? ?

## 2021-08-03 NOTE — Assessment & Plan Note (Signed)
Hx of elevated lipids; has not been on medication prior ?Previously controlled with diet and exercise ?We recommend diet low in saturated fat and regular exercise - 30 min at least 5 times per week ?Will repeat FLP; pt will come back for labs tomorrow  ?

## 2021-08-03 NOTE — Assessment & Plan Note (Signed)
Hx of elevated serum glucose; no formal A1c check ?Hx of htn, and obesity Body mass index is 34.31 kg/m?[place] Kitchen ?Recommend use of A1c ?Continue to recommend balanced, lower carb meals. Smaller meal size, adding snacks. Choosing water as drink of choice and increasing purposeful exercise. ? ?

## 2021-08-03 NOTE — Assessment & Plan Note (Addendum)
Acute concern ?Wife also "sick with same symptoms" ?Pt reports fatigue, congestion, sore throat, cough, chest tightness and SOB ?LCTAB, denies sinus tenderness on exam ?Recommend flonase to assist ?Recommend zyrtec to assist ?

## 2021-08-06 ENCOUNTER — Encounter: Payer: Self-pay | Admitting: Family Medicine

## 2021-08-06 LAB — COMPREHENSIVE METABOLIC PANEL
ALT: 47 IU/L — ABNORMAL HIGH (ref 0–44)
AST: 36 IU/L (ref 0–40)
Albumin/Globulin Ratio: 1.5 (ref 1.2–2.2)
Albumin: 4.4 g/dL (ref 3.8–4.9)
Alkaline Phosphatase: 89 IU/L (ref 44–121)
BUN/Creatinine Ratio: 11 (ref 10–24)
BUN: 13 mg/dL (ref 8–27)
Bilirubin Total: 0.7 mg/dL (ref 0.0–1.2)
CO2: 23 mmol/L (ref 20–29)
Calcium: 9.8 mg/dL (ref 8.6–10.2)
Chloride: 102 mmol/L (ref 96–106)
Creatinine, Ser: 1.15 mg/dL (ref 0.76–1.27)
Globulin, Total: 2.9 g/dL (ref 1.5–4.5)
Glucose: 106 mg/dL — ABNORMAL HIGH (ref 70–99)
Potassium: 4.6 mmol/L (ref 3.5–5.2)
Sodium: 139 mmol/L (ref 134–144)
Total Protein: 7.3 g/dL (ref 6.0–8.5)
eGFR: 73 mL/min/{1.73_m2} (ref >59)

## 2021-08-06 LAB — LIPID PANEL
Chol/HDL Ratio: 3.4 ratio (ref 0.0–5.0)
Cholesterol, Total: 162 mg/dL (ref 100–199)
HDL: 48 mg/dL (ref >39)
LDL Chol Calc (NIH): 103 mg/dL — ABNORMAL HIGH (ref 0–99)
Triglycerides: 55 mg/dL (ref 0–149)
VLDL Cholesterol Cal: 11 mg/dL (ref 5–40)

## 2021-08-06 LAB — CBC WITH DIFFERENTIAL/PLATELET
Basophils Absolute: 0.1 10*3/uL (ref 0.0–0.2)
Basos: 1 %
EOS (ABSOLUTE): 0.4 10*3/uL (ref 0.0–0.4)
Eos: 4 %
Hematocrit: 49.8 % (ref 37.5–51.0)
Hemoglobin: 16.4 g/dL (ref 13.0–17.7)
Immature Grans (Abs): 0 10*3/uL (ref 0.0–0.1)
Immature Granulocytes: 0 %
Lymphocytes Absolute: 3.4 10*3/uL — ABNORMAL HIGH (ref 0.7–3.1)
Lymphs: 30 %
MCH: 28.8 pg (ref 26.6–33.0)
MCHC: 32.9 g/dL (ref 31.5–35.7)
MCV: 88 fL (ref 79–97)
Monocytes Absolute: 1 10*3/uL — ABNORMAL HIGH (ref 0.1–0.9)
Monocytes: 9 %
Neutrophils Absolute: 6.3 10*3/uL (ref 1.4–7.0)
Neutrophils: 56 %
Platelets: 318 10*3/uL (ref 150–450)
RBC: 5.69 x10E6/uL (ref 4.14–5.80)
RDW: 12.8 % (ref 11.6–15.4)
WBC: 11.2 10*3/uL — ABNORMAL HIGH (ref 3.4–10.8)

## 2021-08-06 LAB — HEMOGLOBIN A1C
Est. average glucose Bld gHb Est-mCnc: 120 mg/dL
Hgb A1c MFr Bld: 5.8 % — ABNORMAL HIGH (ref 4.8–5.6)

## 2021-08-06 LAB — PSA: Prostate Specific Ag, Serum: 0.9 ng/mL (ref 0.0–4.0)

## 2021-09-02 ENCOUNTER — Ambulatory Visit: Payer: 59 | Admitting: Family Medicine

## 2021-09-02 ENCOUNTER — Encounter: Payer: Self-pay | Admitting: Family Medicine

## 2021-09-02 VITALS — BP 137/89 | HR 73 | Temp 97.9°F | Resp 14 | Wt 207.0 lb

## 2021-09-02 DIAGNOSIS — I10 Essential (primary) hypertension: Secondary | ICD-10-CM

## 2021-09-02 DIAGNOSIS — R42 Dizziness and giddiness: Secondary | ICD-10-CM | POA: Diagnosis not present

## 2021-09-02 NOTE — Progress Notes (Signed)
?  ? ?I,Roshena L Chambers,acting as a scribe for Gwyneth Sprout, FNP.,have documented all relevant documentation on the behalf of Gwyneth Sprout, FNP,as directed by  Gwyneth Sprout, FNP while in the presence of Gwyneth Sprout, FNP.  ? ?Established patient visit ? ? ?Patient: Curtis Combs   DOB: 1960/09/28   61 y.o. Male  MRN: [id number] ?Visit Date: 09/02/2021 ? ?Today's healthcare provider: Gwyneth Sprout, FNP  ?Re Introduced to nurse practitioner role and practice setting.  All questions answered.  Discussed provider/patient relationship and expectations. ? ? ?Chief Complaint  ?Patient presents with  ? Hypertension  ? ?Subjective  ?  ?HPI  ?Hypertension, follow-up ? ?BP Readings from Last 3 Encounters:  ?09/02/21 137/89  ?08/03/21 136/90  ?02/01/21 124/88  ? Wt Readings from Last 3 Encounters:  ?09/02/21 207 lb (93.9 kg)  ?08/03/21 206 lb 3.2 oz (93.5 kg)  ?02/01/21 204 lb 3.2 oz (92.6 kg)  ?  ? ?He was last seen for hypertension 4 weeks ago.  ?BP at that visit was 136/90. Management since that visit includes continuing same medication. ? ?He reports good compliance with treatment. ?He is not having side effects.  ?He is following a Regular diet. ?He is exercising. ?He does not smoke. ? ?Use of agents associated with hypertension: none.  ? ?Outside blood pressures are 130/70. ?Symptoms: ?No chest pain No chest pressure  ?No palpitations No syncope  ?No dyspnea No orthopnea  ?No paroxysmal nocturnal dyspnea No lower extremity edema  ? ? ? ?Pertinent labs ?Lab Results  ?Component Value Date  ? CHOL 162 08/05/2021  ? HDL 48 08/05/2021  ? LDLCALC 103 (H) 08/05/2021  ? TRIG 55 08/05/2021  ? CHOLHDL 3.4 08/05/2021  ? Lab Results  ?Component Value Date  ? NA 139 08/05/2021  ? K 4.6 08/05/2021  ? CREATININE 1.15 08/05/2021  ? EGFR 73 08/05/2021  ? GLUCOSE 106 (H) 08/05/2021  ? TSH 2.030 02/05/2020  ?  ? ?The 10-year ASCVD risk score (Arnett DK, et al., 2019) is:  9.6% ? ?---------------------------------------------------------------------------------------------------  ? ?Medications: ?Outpatient Medications Prior to Visit  ?Medication Sig  ? amLODipine (NORVASC) 5 MG tablet Take 1 tablet (5 mg total) by mouth daily.  ? cetirizine (ZYRTEC) 10 MG tablet Take 1 tablet (10 mg total) by mouth daily.  ? fluticasone (FLONASE) 50 MCG/ACT nasal spray Place 2 sprays into both nostrils daily.  ? valACYclovir (VALTREX) 1000 MG tablet Take 1 tablet (1,000 mg total) by mouth 2 (two) times daily.  ? ?No facility-administered medications prior to visit.  ? ? ?Review of Systems  ?Constitutional:  Negative for appetite change, chills and fever.  ?Respiratory:  Negative for chest tightness, shortness of breath and wheezing.   ?Cardiovascular:  Negative for chest pain and palpitations.  ?Gastrointestinal:  Negative for abdominal pain, nausea and vomiting.  ?Neurological:  Positive for dizziness and light-headedness.  ? ? ?  Objective  ?  ?BP 137/89 (BP Location: Right Arm, Patient Position: Sitting, Cuff Size: Large)   Pulse 73   Temp 97.9 ?F (36.6 ?C) (Oral)   Resp 14   Wt 207 lb (93.9 kg)   SpO2 96% Comment: room air  BMI 34.45 kg/m?  ? ? ?Today's Vitals  ? 09/02/21 1549 09/02/21 1552  ?BP: (!) 147/96 137/89  ?Pulse: 73   ?Resp: 14   ?Temp: 97.9 ?F (36.6 ?C)   ?TempSrc: Oral   ?SpO2: 96%   ?Weight: 207 lb (93.9 kg)   ? ?Body  mass index is 34.45 kg/m?.  ? ?Physical Exam ?Vitals and nursing note reviewed.  ?Constitutional:   ?   Appearance: Normal appearance. He is obese.  ?HENT:  ?   Head: Normocephalic and atraumatic.  ?Eyes:  ?   Pupils: Pupils are equal, round, and reactive to light.  ?Cardiovascular:  ?   Rate and Rhythm: Normal rate and regular rhythm.  ?   Pulses: Normal pulses.  ?   Heart sounds: Normal heart sounds.  ?Pulmonary:  ?   Effort: Pulmonary effort is normal.  ?   Breath sounds: Normal breath sounds.  ?Musculoskeletal:     ?   General: No swelling. Normal range of  motion.  ?   Cervical back: Normal range of motion.  ?   Right lower leg: No edema.  ?   Left lower leg: No edema.  ?Skin: ?   General: Skin is warm and dry.  ?   Capillary Refill: Capillary refill takes less than 2 seconds.  ?Neurological:  ?   General: No focal deficit present.  ?   Mental Status: He is alert and oriented to person, place, and time. Mental status is at baseline.  ?Psychiatric:     ?   Mood and Affect: Mood normal.     ?   Behavior: Behavior normal.     ?   Thought Content: Thought content normal.     ?   Judgment: Judgment normal.  ?  ? ?No results found for any visits on 09/02/21. ? Assessment & Plan  ?  ? ?Problem List Items Addressed This Visit   ? ?  ? Cardiovascular and Mediastinum  ? Hypertension - Primary  ?  Chronic, stable on 2nd check; first check elevated ?Denies CP ?Denies SOB/ DOE ?Denies low blood pressure/hypotension ?Denies vision changes ?No LE Edema noted on exam ?Continue medication, Norvasc 5 mg QD ?Denies side effects ?RTC 5 months ?Seek emergent care if you develop chest pain or chest pressure ? ?  ?  ? Relevant Orders  ? Ambulatory referral to Cardiology  ?  ? Other  ? Episodic lightheadedness  ?  Occurs with activity, can be hot or cold ?Comes with exertion with associated dizziness ?Typically resolves within 5 minutes or with food or drink ?Denies syncope ?Will refer to cards STAT ?Advise if even happens again to go to ER ? ?  ?  ? Relevant Orders  ? Ambulatory referral to Cardiology  ? Orthostatic dizziness  ?  Occurs with activity, can be hot or cold ?Typically occurs when bending or leaning ?Comes with exertion with associated lightheadedness ?Typically resolves within 5 minutes or with food or drink ?Denies syncope ?Will refer to cards STAT ?Advise if even happens again to go to ER ? ? ?  ?  ? Relevant Orders  ? Ambulatory referral to Cardiology  ? ? ? ?Return in about 5 months (around 02/02/2022) for chonic disease management.  ?   ? ?I, Gwyneth Sprout, FNP, have  reviewed all documentation for this visit. The documentation on 09/02/21 for the exam, diagnosis, procedures, and orders are all accurate and complete. ? ? ? ?Gwyneth Sprout, FNP  ?[place] ?[phone] (phone) ?[phone] (fax) ? ?[facility]  ?

## 2021-09-02 NOTE — Assessment & Plan Note (Signed)
Occurs with activity, can be hot or cold ?Typically occurs when bending or leaning ?Comes with exertion with associated lightheadedness ?Typically resolves within 5 minutes or with food or drink ?Denies syncope ?Will refer to cards STAT ?Advise if even happens again to go to ER ? ?

## 2021-09-02 NOTE — Assessment & Plan Note (Signed)
Chronic, stable on 2nd check; first check elevated ?Denies CP ?Denies SOB/ DOE ?Denies low blood pressure/hypotension ?Denies vision changes ?No LE Edema noted on exam ?Continue medication, Norvasc 5 mg QD ?Denies side effects ?RTC 5 months ?Seek emergent care if you develop chest pain or chest pressure ? ?

## 2021-09-02 NOTE — Assessment & Plan Note (Signed)
Occurs with activity, can be hot or cold ?Comes with exertion with associated dizziness ?Typically resolves within 5 minutes or with food or drink ?Denies syncope ?Will refer to cards STAT ?Advise if even happens again to go to ER ?

## 2021-09-13 ENCOUNTER — Telehealth: Payer: Self-pay

## 2021-09-13 NOTE — Telephone Encounter (Signed)
Copied from [place] [id number]. Topic: General - Other ?>> Sep 13, 2021  4:40 PM Pawlus, Brayton Layman A wrote: ?Reason for CRM: Vida Roller from [facility] - Cardiology requesting the pts latest EKG images be faxed over to [phone]. ?>> Sep 13, 2021  4:42 PM Pawlus, Brayton Layman A wrote: ?Caller also stated the pt has an appt at [facility] cardiology tomorrow 5/9 FYI ?

## 2021-09-14 NOTE — Telephone Encounter (Signed)
There is no EKG on file for patient. ?

## 2021-10-11 ENCOUNTER — Ambulatory Visit: Payer: 59 | Admitting: Cardiovascular Disease

## 2021-10-26 ENCOUNTER — Encounter: Payer: Self-pay | Admitting: Dermatology

## 2021-11-18 ENCOUNTER — Ambulatory Visit: Payer: 59 | Admitting: Dermatology

## 2021-11-18 DIAGNOSIS — C44629 Squamous cell carcinoma of skin of left upper limb, including shoulder: Secondary | ICD-10-CM

## 2021-11-18 DIAGNOSIS — D485 Neoplasm of uncertain behavior of skin: Secondary | ICD-10-CM

## 2021-11-18 DIAGNOSIS — L578 Other skin changes due to chronic exposure to nonionizing radiation: Secondary | ICD-10-CM | POA: Diagnosis not present

## 2021-11-18 DIAGNOSIS — L570 Actinic keratosis: Secondary | ICD-10-CM

## 2021-11-18 DIAGNOSIS — Z85828 Personal history of other malignant neoplasm of skin: Secondary | ICD-10-CM | POA: Diagnosis not present

## 2021-11-18 DIAGNOSIS — D0461 Carcinoma in situ of skin of right upper limb, including shoulder: Secondary | ICD-10-CM | POA: Diagnosis not present

## 2021-11-18 NOTE — Patient Instructions (Signed)
Cryotherapy Aftercare  Wash gently with soap and water everyday.   Apply Vaseline and Band-Aid daily until healed.    Wound Care Instructions  Cleanse wound gently with soap and water once a day then pat dry with clean gauze. Apply a thing coat of Petrolatum (petroleum jelly, "Vaseline") over the wound (unless you have an allergy to this). We recommend that you use a new, sterile tube of Vaseline. Do not pick or remove scabs. Do not remove the yellow or white "healing tissue" from the base of the wound.  Cover the wound with fresh, clean, nonstick gauze and secure with paper tape. You may use Band-Aids in place of gauze and tape if the would is small enough, but would recommend trimming much of the tape off as there is often too much. Sometimes Band-Aids can irritate the skin.  You should call the office for your biopsy report after 1 week if you have not already been contacted.  If you experience any problems, such as abnormal amounts of bleeding, swelling, significant bruising, significant pain, or evidence of infection, please call the office immediately.  FOR ADULT SURGERY PATIENTS: If you need something for pain relief you may take 1 extra strength Tylenol (acetaminophen) AND 2 Ibuprofen (200mg each) together every 4 hours as needed for pain. (do not take these if you are allergic to them or if you have a reason you should not take them.) Typically, you may only need pain medication for 1 to 3 days.       Due to recent changes in healthcare laws, you may see results of your pathology and/or laboratory studies on MyChart before the doctors have had a chance to review them. We understand that in some cases there may be results that are confusing or concerning to you. Please understand that not all results are received at the same time and often the doctors may need to interpret multiple results in order to provide you with the best plan of care or course of treatment. Therefore, we ask  that you please give us 2 business days to thoroughly review all your results before contacting the office for clarification. Should we see a critical lab result, you will be contacted sooner.   If You Need Anything After Your Visit  If you have any questions or concerns for your doctor, please call our main line at [phone] and press option 4 to reach your doctor's medical assistant. If no one answers, please leave a voicemail as directed and we will return your call as soon as possible. Messages left after 4 pm will be answered the following business day.   You may also send us a message via MyChart. We typically respond to MyChart messages within 1-2 business days.  For prescription refills, please ask your pharmacy to contact our office. Our fax number is [phone].  If you have an urgent issue when the clinic is closed that cannot wait until the next business day, you can page your doctor at the number below.    Please note that while we do our best to be available for urgent issues outside of office hours, we are not available 24/7.   If you have an urgent issue and are unable to reach us, you may choose to seek medical care at your doctor's office, retail clinic, urgent care center, or emergency room.  If you have a medical emergency, please immediately call 911 or go to the emergency department.  Pager Numbers  - Dr. Kowalski:   [phone]  - Dr. Moye: [phone]  - Dr. Stewart: [phone]  In the event of inclement weather, please call our main line at [phone] for an update on the status of any delays or closures.  Dermatology Medication Tips: Please keep the boxes that topical medications come in in order to help keep track of the instructions about where and how to use these. Pharmacies typically print the medication instructions only on the boxes and not directly on the medication tubes.   If your medication is too expensive, please contact our office at  [phone] option 4 or send us a message through MyChart.   We are unable to tell what your co-pay for medications will be in advance as this is different depending on your insurance coverage. However, we may be able to find a substitute medication at lower cost or fill out paperwork to get insurance to cover a needed medication.   If a prior authorization is required to get your medication covered by your insurance company, please allow us 1-2 business days to complete this process.  Drug prices often vary depending on where the prescription is filled and some pharmacies may offer cheaper prices.  The website www.goodrx.com contains coupons for medications through different pharmacies. The prices here do not account for what the cost may be with help from insurance (it may be cheaper with your insurance), but the website can give you the price if you did not use any insurance.  - You can print the associated coupon and take it with your prescription to the pharmacy.  - You may also stop by our office during regular business hours and pick up a GoodRx coupon card.  - If you need your prescription sent electronically to a different pharmacy, notify our office through [facility] MyChart or by phone at [phone] option 4.     Si Usted Necesita Algo Despus de Su Visita  Tambin puede enviarnos un mensaje a travs de MyChart. Por lo general respondemos a los mensajes de MyChart en el transcurso de 1 a 2 das hbiles.  Para renovar recetas, por favor pida a su farmacia que se ponga en contacto con nuestra oficina. Nuestro nmero de fax es el [phone].  Si tiene un asunto urgente cuando la clnica est cerrada y que no puede esperar hasta el siguiente da hbil, puede llamar/localizar a su doctor(a) al nmero que aparece a continuacin.   Por favor, tenga en cuenta que aunque hacemos todo lo posible para estar disponibles para asuntos urgentes fuera del horario de oficina, no estamos  disponibles las 24 horas del da, los 7 das de la semana.   Si tiene un problema urgente y no puede comunicarse con nosotros, puede optar por buscar atencin mdica  en el consultorio de su doctor(a), en una clnica privada, en un centro de atencin urgente o en una sala de emergencias.  Si tiene una emergencia mdica, por favor llame inmediatamente al 911 o vaya a la sala de emergencias.  Nmeros de bper  - Dr. Kowalski: [phone]  - Dra. Moye: [phone]  - Dra. Stewart: [phone]  En caso de inclemencias del tiempo, por favor llame a nuestra lnea principal al [phone] para una actualizacin sobre el estado de cualquier retraso o cierre.  Consejos para la medicacin en dermatologa: Por favor, guarde las cajas en las que vienen los medicamentos de uso tpico para ayudarle a seguir las instrucciones sobre dnde y cmo usarlos. Las farmacias generalmente imprimen las instrucciones del medicamento slo en   las cajas y no directamente en los tubos del medicamento.   Si su medicamento es muy caro, por favor, pngase en contacto con nuestra oficina llamando al [phone] y presione la opcin 4 o envenos un mensaje a travs de MyChart.   No podemos decirle cul ser su copago por los medicamentos por adelantado ya que esto es diferente dependiendo de la cobertura de su seguro. Sin embargo, es posible que podamos encontrar un medicamento sustituto a menor costo o llenar un formulario para que el seguro cubra el medicamento que se considera necesario.   Si se requiere una autorizacin previa para que su compaa de seguros cubra su medicamento, por favor permtanos de 1 a 2 das hbiles para completar este proceso.  Los precios de los medicamentos varan con frecuencia dependiendo del lugar de dnde se surte la receta y alguna farmacias pueden ofrecer precios ms baratos.  El sitio web www.goodrx.com tiene cupones para medicamentos de diferentes farmacias. Los precios aqu no  tienen en cuenta lo que podra costar con la ayuda del seguro (puede ser ms barato con su seguro), pero el sitio web puede darle el precio si no utiliz ningn seguro.  - Puede imprimir el cupn correspondiente y llevarlo con su receta a la farmacia.  - Tambin puede pasar por nuestra oficina durante el horario de atencin regular y recoger una tarjeta de cupones de GoodRx.  - Si necesita que su receta se enve electrnicamente a una farmacia diferente, informe a nuestra oficina a travs de MyChart de [place] o por telfono llamando al [phone] y presione la opcin 4.  

## 2021-11-18 NOTE — Progress Notes (Signed)
Follow-Up Visit   Subjective  Curtis Combs is a 61 y.o. male who presents for the following: Other (Spots on bilateral hands that just popped up). The patient has spots, moles and lesions to be evaluated, some may be new or changing and the patient has concerns that these could be cancer.  The following portions of the chart were reviewed this encounter and updated as appropriate:   Tobacco  Allergies  Meds  Problems  Med Hx  Surg Hx  Fam Hx     Review of Systems:  No other skin or systemic complaints except as noted in HPI or Assessment and Plan.  Objective  Well appearing patient in no apparent distress; mood and affect are within normal limits.  A focused examination was performed including hands. Relevant physical exam findings are noted in the Assessment and Plan.  Left hand x 1, right hand x 2 (3) Erythematous thin papules/macules with gritty scale.   Left dorsum hand Hyperkeratotic papule 1.2 cm  Right Proximal 4th Finger Hyperkeratotic papule 1.1 cm   Assessment & Plan   Actinic Damage - chronic, secondary to cumulative UV radiation exposure/sun exposure over time - diffuse scaly erythematous macules with underlying dyspigmentation - Recommend daily broad spectrum sunscreen SPF 30+ to sun-exposed areas, reapply every 2 hours as needed.  - Recommend staying in the shade or wearing long sleeves, sun glasses (UVA+UVB protection) and wide brim hats (4-inch brim around the entire circumference of the hat). - Call for new or changing lesions.  History of Squamous Cell Carcinoma of the Skin - No evidence of recurrence today - No lymphadenopathy - Recommend regular full body skin exams - Recommend daily broad spectrum sunscreen SPF 30+ to sun-exposed areas, reapply every 2 hours as needed.  - Call if any new or changing lesions are noted between office visits  AK (actinic keratosis) (3) Left hand x 1, right hand x 2  Destruction of lesion - Left hand x 1, right  hand x 2 Complexity: simple   Destruction method: cryotherapy   Informed consent: discussed and consent obtained   Timeout:  patient name, date of birth, surgical site, and procedure verified Lesion destroyed using liquid nitrogen: Yes   Region frozen until ice ball extended beyond lesion: Yes   Outcome: patient tolerated procedure well with no complications   Post-procedure details: wound care instructions given    Neoplasm of uncertain behavior of skin (2) Left dorsum hand  Epidermal / dermal shaving  Lesion diameter (cm):  1.2 Informed consent: discussed and consent obtained   Timeout: patient name, date of birth, surgical site, and procedure verified   Procedure prep:  Patient was prepped and draped in usual sterile fashion Prep type:  Isopropyl alcohol Anesthesia: the lesion was anesthetized in a standard fashion   Anesthetic:  1% lidocaine w/ epinephrine 1-100,000 buffered w/ 8.4% NaHCO3 Instrument used: flexible razor blade   Hemostasis achieved with: pressure, aluminum chloride and electrodesiccation   Outcome: patient tolerated procedure well   Post-procedure details: sterile dressing applied and wound care instructions given   Dressing type: bandage and petrolatum    Destruction of lesion Complexity: extensive   Destruction method: electrodesiccation and curettage   Informed consent: discussed and consent obtained   Timeout:  patient name, date of birth, surgical site, and procedure verified Procedure prep:  Patient was prepped and draped in usual sterile fashion Prep type:  Isopropyl alcohol Anesthesia: the lesion was anesthetized in a standard fashion   Anesthetic:  1%  lidocaine w/ epinephrine 1-100,000 buffered w/ 8.4% NaHCO3 Curettage performed in three different directions: Yes   Electrodesiccation performed over the curetted area: Yes   Lesion length (cm):  1.2 Lesion width (cm):  1.2 Margin per side (cm):  0.2 Final wound size (cm):  1.6 Hemostasis achieved  with:  pressure and aluminum chloride Outcome: patient tolerated procedure well with no complications   Post-procedure details: sterile dressing applied and wound care instructions given   Dressing type: bandage and petrolatum    Specimen 1 - Surgical pathology Differential Diagnosis: SCC vs other Check Margins: No EDC today  Right Proximal 4th Finger  Epidermal / dermal shaving  Lesion diameter (cm):  1.1 Informed consent: discussed and consent obtained   Timeout: patient name, date of birth, surgical site, and procedure verified   Procedure prep:  Patient was prepped and draped in usual sterile fashion Prep type:  Isopropyl alcohol Anesthesia: the lesion was anesthetized in a standard fashion   Anesthetic:  1% lidocaine w/ epinephrine 1-100,000 buffered w/ 8.4% NaHCO3 Instrument used: flexible razor blade   Hemostasis achieved with: pressure, aluminum chloride and electrodesiccation   Outcome: patient tolerated procedure well   Post-procedure details: sterile dressing applied and wound care instructions given   Dressing type: bandage and petrolatum    Destruction of lesion Complexity: extensive   Destruction method: electrodesiccation and curettage   Informed consent: discussed and consent obtained   Timeout:  patient name, date of birth, surgical site, and procedure verified Procedure prep:  Patient was prepped and draped in usual sterile fashion Prep type:  Isopropyl alcohol Anesthesia: the lesion was anesthetized in a standard fashion   Anesthetic:  1% lidocaine w/ epinephrine 1-100,000 buffered w/ 8.4% NaHCO3 Curettage performed in three different directions: Yes   Electrodesiccation performed over the curetted area: Yes   Lesion length (cm):  1.1 Lesion width (cm):  1.1 Margin per side (cm):  0.2 Final wound size (cm):  1.5 Hemostasis achieved with:  pressure and aluminum chloride Outcome: patient tolerated procedure well with no complications   Post-procedure  details: sterile dressing applied and wound care instructions given   Dressing type: bandage and petrolatum    Specimen 2 - Surgical pathology Differential Diagnosis: SCC vs other  Check Margins: No EDC today  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  I, Ashok Cordia, CMA, am acting as scribe for Sarina Ser, MD . Documentation: I have reviewed the above documentation for accuracy and completeness, and I agree with the above.  Sarina Ser, MD

## 2021-11-23 ENCOUNTER — Telehealth: Payer: Self-pay

## 2021-11-23 NOTE — Telephone Encounter (Addendum)
Called and discussed bx results with patient. Patient verbalized understanding and denied further questions.    ----- Message from Ralene Bathe, MD sent at 11/23/2021  2:22 PM EDT ----- Diagnosis 1. Skin , left dorsum hand WELL DIFFERENTIATED SQUAMOUS CELL CARCINOMA WITH SUPERFICIAL INFILTRATION, CLOSE TO MARGIN 2. Skin , right proximal 4th finger SQUAMOUS CELL CARCINOMA IN SITU, CLOSE TO MARGIN  1&2 - both Cancer = SCC Both already treated Recheck next visit

## 2021-11-27 ENCOUNTER — Encounter: Payer: Self-pay | Admitting: Dermatology

## 2022-01-31 ENCOUNTER — Encounter: Payer: Self-pay | Admitting: Dermatology

## 2022-01-31 ENCOUNTER — Ambulatory Visit: Payer: 59 | Admitting: Dermatology

## 2022-01-31 DIAGNOSIS — L578 Other skin changes due to chronic exposure to nonionizing radiation: Secondary | ICD-10-CM | POA: Diagnosis not present

## 2022-01-31 DIAGNOSIS — Z79899 Other long term (current) drug therapy: Secondary | ICD-10-CM

## 2022-01-31 DIAGNOSIS — Z85828 Personal history of other malignant neoplasm of skin: Secondary | ICD-10-CM | POA: Diagnosis not present

## 2022-01-31 DIAGNOSIS — L570 Actinic keratosis: Secondary | ICD-10-CM | POA: Diagnosis not present

## 2022-01-31 DIAGNOSIS — Z5111 Encounter for antineoplastic chemotherapy: Secondary | ICD-10-CM

## 2022-01-31 NOTE — Progress Notes (Unsigned)
Follow-Up Visit   Subjective  Curtis Combs is a 61 y.o. male who presents for the following: Actinic Keratosis (2 month recheck. AK's on dorsal hands. SCC's on left dorsal hand and R-4 proximal finger). The patient has spots, moles and lesions to be evaluated, some may be new or changing and the patient has concerns that these could be cancer.  The following portions of the chart were reviewed this encounter and updated as appropriate:  Tobacco  Allergies  Meds  Problems  Med Hx  Surg Hx  Fam Hx     Review of systems: No other skin or systemic complaints except as noted in HPI or Assessment and Plan.  Objective  Well appearing patient in no apparent distress; mood and affect are within normal limits.  A focused examination was performed including face, arms, hands. Relevant physical exam findings are noted in the Assessment and Plan.  right forearm x1, left dorsum had x1, right dorsum hand x3 (5) Erythematous thin papules/macules with gritty scale.    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 EDC. 11/18/2021 - No evidence of recurrence today - Recommend regular full body skin exams - Recommend daily broad spectrum sunscreen SPF 30+ to sun-exposed areas, reapply every 2 hours as needed.  - Call if any new or changing lesions are noted between office visits   History of Squamous Cell Carcinoma of the Skin. WD SCC. Left dorsum hand. [facility] 11/18/2021 - No evidence of recurrence today - No lymphadenopathy - Recommend regular full body skin exams - Recommend daily broad spectrum sunscreen SPF 30+ to sun-exposed areas, reapply every 2 hours as needed.  - Call if any new or changing lesions are noted between office visits  Actinic Damage - Severe, confluent actinic changes with pre-cancerous actinic keratoses  - Severe, chronic, not at goal, secondary to cumulative UV radiation exposure over time - diffuse scaly  erythematous macules and papules with underlying dyspigmentation - Discussed Prescription "Field Treatment" for Severe, Chronic Confluent Actinic Changes with Pre-Cancerous Actinic Keratoses Field treatment involves treatment of an entire area of skin that has confluent Actinic Changes (Sun/ Ultraviolet light damage) and PreCancerous Actinic Keratoses by method of PhotoDynamic Therapy (PDT) and/or prescription Topical Chemotherapy agents such as 5-fluorouracil, 5-fluorouracil/calcipotriene, and/or imiquimod.  The purpose is to decrease the number of clinically evident and subclinical PreCancerous lesions to prevent progression to development of skin cancer by chemically destroying early precancer changes that may or may not be visible.  It has been shown to reduce the risk of developing skin cancer in the treated area. As a result of treatment, redness, scaling, crusting, and open sores may occur during treatment course. One or more than one of these methods may be used and may have to be used several times to control, suppress and eliminate the PreCancerous changes. Discussed treatment course, expected reaction, and possible side effects. - Recommend daily broad spectrum sunscreen SPF 30+ to sun-exposed areas, reapply every 2 hours as needed.  - Staying in the shade or wearing long sleeves, sun glasses (UVA+UVB protection) and wide brim hats (4-inch brim around the entire circumference of the hat) are also recommended. - Call for new or changing lesions.  Start in 1 month - Start 5-fluorouracil/calcipotriene cream twice a day for 7 days to affected areas including forehead temples, twice a day for 2 weeks to hands. Prescription sent to [facility]. Patient advised they will receive an  email to purchase the medication online and have it sent to their home. Patient provided with handout reviewing treatment course and side effects and advised to call or message [place] on MyChart with any  concerns.   AK (actinic keratosis) (5) right forearm x1, left dorsum had x1, right dorsum hand x3  Actinic keratoses are precancerous spots that appear secondary to cumulative UV radiation exposure/sun exposure over time. They are chronic with expected duration over 1 year. A portion of actinic keratoses will progress to squamous cell carcinoma of the skin. It is not possible to reliably predict which spots will progress to skin cancer and so treatment is recommended to prevent development of skin cancer.  Recommend daily broad spectrum sunscreen SPF 30+ to sun-exposed areas, reapply every 2 hours as needed.  Recommend staying in the shade or wearing long sleeves, sun glasses (UVA+UVB protection) and wide brim hats (4-inch brim around the entire circumference of the hat). Call for new or changing lesions.  Destruction of lesion - right forearm x1, left dorsum had x1, right dorsum hand x3 Complexity: simple   Destruction method: cryotherapy   Informed consent: discussed and consent obtained   Timeout:  patient name, date of birth, surgical site, and procedure verified Lesion destroyed using liquid nitrogen: Yes   Region frozen until ice ball extended beyond lesion: Yes   Outcome: patient tolerated procedure well with no complications   Post-procedure details: wound care instructions given   Additional details:  Prior to procedure, discussed risks of blister formation, small wound, skin dyspigmentation, or rare scar following cryotherapy. Recommend Vaseline ointment to treated areas while healing.    Return in about 6 months (around 08/01/2022) for AK Follow Up.Documentation: I have reviewed the above documentation for accuracy and completeness, and I agree with the above.  Sarina Ser, MD

## 2022-01-31 NOTE — Patient Instructions (Addendum)
Start in 1 month - Start 5-fluorouracil/calcipotriene cream twice a day for 7 days to affected areas including forehead temples, twice a day for 2 weeks to hands. Prescription sent to [facility]. Patient advised they will receive an email to purchase the medication online and have it sent to their home. Patient provided with handout reviewing treatment course and side effects and advised to call or message [place] on MyChart with any concerns.   Instructions for [facility] Medications  One or more of your medications was sent to the [facility]. You will receive an email from them and can purchase the medicine through that link. It will then be mailed to your home at the address you confirmed. If for any reason you do not receive an email from them, please check your spam folder. If you still do not find the email, please let us know. [facility] phone number is [phone].    Cryotherapy Aftercare  Wash gently with soap and water everyday.   Apply Vaseline daily until healed.   Recommend daily broad spectrum sunscreen SPF 30+ to sun-exposed areas, reapply every 2 hours as needed. Call for new or changing lesions.  Staying in the shade or wearing long sleeves, sun glasses (UVA+UVB protection) and wide brim hats (4-inch brim around the entire circumference of the hat) are also recommended for sun protection.     Due to recent changes in healthcare laws, you may see results of your pathology and/or laboratory studies on MyChart before the doctors have had a chance to review them. We understand that in some cases there may be results that are confusing or concerning to you. Please understand that not all results are received at the same time and often the doctors may need to interpret multiple results in order to provide you with the best plan of care or course of treatment. Therefore, we ask that you please give [place] 2 business days to  thoroughly review all your results before contacting the office for clarification. Should we see a critical lab result, you will be contacted sooner.   If You Need Anything After Your Visit  If you have any questions or concerns for your doctor, please call our main line at [phone] and press option 4 to reach your doctor's medical assistant. If no one answers, please leave a voicemail as directed and we will return your call as soon as possible. Messages left after 4 pm will be answered the following business day.   You may also send [place] a message via [place]. We typically respond to MyChart messages within 1-2 business days.  For prescription refills, please ask your pharmacy to contact our office. Our fax number is [phone].  If you have an urgent issue when the clinic is closed that cannot wait until the next business day, you can page your doctor at the number below.    Please note that while we do our best to be available for urgent issues outside of office hours, we are not available 24/7.   If you have an urgent issue and are unable to reach [place], you may choose to seek medical care at your doctor's office, retail clinic, urgent care center, or emergency room.  If you have a medical emergency, please immediately call 911 or go to the emergency department.  Pager Numbers  - Dr. Nehemiah Massed: [phone]  - Dr. Laurence Ferrari: [phone]  - Dr. Nicole Kindred: [phone]  In the event of inclement weather, please  call our main line at [phone] for an update on the status of any delays or closures.  Dermatology Medication Tips: Please keep the boxes that topical medications come in in order to help keep track of the instructions about where and how to use these. Pharmacies typically print the medication instructions only on the boxes and not directly on the medication tubes.   If your medication is too expensive, please contact our office at [phone] option 4 or send [place] a message  through [place].   We are unable to tell what your co-pay for medications will be in advance as this is different depending on your insurance coverage. However, we may be able to find a substitute medication at lower cost or fill out paperwork to get insurance to cover a needed medication.   If a prior authorization is required to get your medication covered by your insurance company, please allow [place] 1-2 business days to complete this process.  Drug prices often vary depending on where the prescription is filled and some pharmacies may offer cheaper prices.  The website www.goodrx.com contains coupons for medications through different pharmacies. The prices here do not account for what the cost may be with help from insurance (it may be cheaper with your insurance), but the website can give you the price if you did not use any insurance.  - You can print the associated coupon and take it with your prescription to the pharmacy.  - You may also stop by our office during regular business hours and pick up a GoodRx coupon card.  - If you need your prescription sent electronically to a different pharmacy, notify our office through [facility] or by phone at [phone] option 4.     Si Usted Necesita Algo Despus de Su Visita  Tambin puede enviarnos un mensaje a travs de Pharmacist, community. Por lo general respondemos a los mensajes de MyChart en el transcurso de 1 a 2 das hbiles.  Para renovar recetas, por favor pida a su farmacia que se ponga en contacto con nuestra oficina. Harland Dingwall de fax es [place] [phone].  Si tiene un asunto urgente cuando la clnica est cerrada y que no puede esperar hasta el siguiente da hbil, puede llamar/localizar a su doctor(a) al nmero que aparece a continuacin.   Por favor, tenga en cuenta que aunque hacemos todo lo posible para estar disponibles para asuntos urgentes fuera del horario de [place], no estamos disponibles las 24 horas del da, los 7 das de  la [place].   Si tiene un problema urgente y no puede comunicarse con nosotros, puede optar por buscar atencin mdica  en el consultorio de su doctor(a), en una clnica privada, en un centro de atencin urgente o en una sala de emergencias.  Si tiene Engineering geologist, por favor llame inmediatamente al 911 o vaya a la sala de emergencias.  Nmeros de bper  - Dr. Nehemiah Massed: [phone]  - Dra. Moye: [phone]  - Dra. Nicole Kindred: [phone]  En caso de inclemencias del [place], por favor llame a Johnsie Kindred principal al [phone] para una actualizacin sobre el [place] de cualquier retraso o cierre.  Consejos para la medicacin en dermatologa: Por favor, guarde las cajas en las que vienen los medicamentos de uso tpico para ayudarle a seguir las instrucciones sobre dnde y cmo usarlos. Las farmacias generalmente imprimen las instrucciones del medicamento slo en las cajas y no directamente en los tubos del [place].   Si su medicamento es [employer], por  favor, pngase en contacto con nuestra oficina llamando al [phone] y presione la opcin 4 o envenos un mensaje a travs de Pharmacist, community.   No podemos decirle cul ser su copago por los medicamentos por adelantado ya que esto es diferente dependiendo de la cobertura de su seguro. Sin embargo, es posible que podamos encontrar un medicamento sustituto a Electrical engineer un formulario para que el seguro cubra el medicamento que se considera necesario.   Si se requiere una autorizacin previa para que su compaa de seguros [place] su medicamento, por favor permtanos de 1 a 2 das hbiles para completar este proceso.  Los precios de los medicamentos varan con frecuencia dependiendo del Environmental consultant de dnde se surte la receta y alguna farmacias pueden ofrecer precios ms baratos.  El sitio web www.goodrx.com tiene cupones para medicamentos de Airline pilot. Los precios aqu no tienen en cuenta lo que podra costar con la ayuda  del seguro (puede ser ms barato con su seguro), pero el sitio web puede darle el precio si no utiliz Research scientist (physical sciences).  - Puede imprimir el cupn correspondiente y llevarlo con su receta a la farmacia.  - Tambin puede pasar por nuestra oficina durante el horario de atencin regular y Charity fundraiser una tarjeta de cupones de GoodRx.  - Si necesita que su receta se enve electrnicamente a una farmacia diferente, informe a nuestra oficina a travs de MyChart de [place] o por telfono llamando al [phone] y presione la opcin 4.

## 2022-02-01 ENCOUNTER — Encounter: Payer: Self-pay | Admitting: Dermatology

## 2022-08-03 ENCOUNTER — Ambulatory Visit: Payer: 59 | Admitting: Dermatology

## 2022-08-03 VITALS — BP 138/82 | HR 72

## 2022-08-03 DIAGNOSIS — D0461 Carcinoma in situ of skin of right upper limb, including shoulder: Secondary | ICD-10-CM

## 2022-08-03 DIAGNOSIS — L570 Actinic keratosis: Secondary | ICD-10-CM

## 2022-08-03 DIAGNOSIS — Z85828 Personal history of other malignant neoplasm of skin: Secondary | ICD-10-CM

## 2022-08-03 DIAGNOSIS — Z5111 Encounter for antineoplastic chemotherapy: Secondary | ICD-10-CM | POA: Diagnosis not present

## 2022-08-03 DIAGNOSIS — L820 Inflamed seborrheic keratosis: Secondary | ICD-10-CM

## 2022-08-03 DIAGNOSIS — D492 Neoplasm of unspecified behavior of bone, soft tissue, and skin: Secondary | ICD-10-CM

## 2022-08-03 DIAGNOSIS — C44722 Squamous cell carcinoma of skin of right lower limb, including hip: Secondary | ICD-10-CM | POA: Diagnosis not present

## 2022-08-03 DIAGNOSIS — L578 Other skin changes due to chronic exposure to nonionizing radiation: Secondary | ICD-10-CM

## 2022-08-03 DIAGNOSIS — Z79899 Other long term (current) drug therapy: Secondary | ICD-10-CM | POA: Diagnosis not present

## 2022-08-03 NOTE — Patient Instructions (Addendum)
Cryotherapy Aftercare  Wash gently with soap and water everyday.   Apply Vaseline and Band-Aid daily until healed.    Wound Care Instructions  Cleanse wound gently with soap and water once a day then pat dry with clean gauze. Apply a thin coat of Petrolatum (petroleum jelly, "Vaseline") over the wound (unless you have an allergy to this). We recommend that you use a new, sterile tube of Vaseline. Do not pick or remove scabs. Do not remove the yellow or white "healing tissue" from the base of the wound.  Cover the wound with fresh, clean, nonstick gauze and secure with paper tape. You may use Band-Aids in place of gauze and tape if the wound is small enough, but would recommend trimming much of the tape off as there is often too much. Sometimes Band-Aids can irritate the skin.  You should call the office for your biopsy report after 1 week if you have not already been contacted.  If you experience any problems, such as abnormal amounts of bleeding, swelling, significant bruising, significant pain, or evidence of infection, please call the office immediately.  FOR ADULT SURGERY PATIENTS: If you need something for pain relief you may take 1 extra strength Tylenol (acetaminophen) AND 2 Ibuprofen (200mg each) together every 4 hours as needed for pain. (do not take these if you are allergic to them or if you have a reason you should not take them.) Typically, you may only need pain medication for 1 to 3 days.     Due to recent changes in healthcare laws, you may see results of your pathology and/or laboratory studies on MyChart before the doctors have had a chance to review them. We understand that in some cases there may be results that are confusing or concerning to you. Please understand that not all results are received at the same time and often the doctors may need to interpret multiple results in order to provide you with the best plan of care or course of treatment. Therefore, we ask that you  please give us 2 business days to thoroughly review all your results before contacting the office for clarification. Should we see a critical lab result, you will be contacted sooner.   If You Need Anything After Your Visit  If you have any questions or concerns for your doctor, please call our main line at [phone] and press option 4 to reach your doctor's medical assistant. If no one answers, please leave a voicemail as directed and we will return your call as soon as possible. Messages left after 4 pm will be answered the following business day.   You may also send us a message via MyChart. We typically respond to MyChart messages within 1-2 business days.  For prescription refills, please ask your pharmacy to contact our office. Our fax number is [phone].  If you have an urgent issue when the clinic is closed that cannot wait until the next business day, you can page your doctor at the number below.    Please note that while we do our best to be available for urgent issues outside of office hours, we are not available 24/7.   If you have an urgent issue and are unable to reach us, you may choose to seek medical care at your doctor's office, retail clinic, urgent care center, or emergency room.  If you have a medical emergency, please immediately call 911 or go to the emergency department.  Pager Numbers  - Dr. Kowalski: [phone]  -   Dr. Moye: [phone]  - Dr. Stewart: [phone]  In the event of inclement weather, please call our main line at [phone] for an update on the status of any delays or closures.  Dermatology Medication Tips: Please keep the boxes that topical medications come in in order to help keep track of the instructions about where and how to use these. Pharmacies typically print the medication instructions only on the boxes and not directly on the medication tubes.   If your medication is too expensive, please contact our office at  [phone] option 4 or send us a message through MyChart.   We are unable to tell what your co-pay for medications will be in advance as this is different depending on your insurance coverage. However, we may be able to find a substitute medication at lower cost or fill out paperwork to get insurance to cover a needed medication.   If a prior authorization is required to get your medication covered by your insurance company, please allow us 1-2 business days to complete this process.  Drug prices often vary depending on where the prescription is filled and some pharmacies may offer cheaper prices.  The website www.goodrx.com contains coupons for medications through different pharmacies. The prices here do not account for what the cost may be with help from insurance (it may be cheaper with your insurance), but the website can give you the price if you did not use any insurance.  - You can print the associated coupon and take it with your prescription to the pharmacy.  - You may also stop by our office during regular business hours and pick up a GoodRx coupon card.  - If you need your prescription sent electronically to a different pharmacy, notify our office through [place] MyChart or by phone at [phone] option 4.     Si Usted Necesita Algo Despus de Su Visita  Tambin puede enviarnos un mensaje a travs de MyChart. Por lo general respondemos a los mensajes de MyChart en el transcurso de 1 a 2 das hbiles.  Para renovar recetas, por favor pida a su farmacia que se ponga en contacto con nuestra oficina. Nuestro nmero de fax es el [phone].  Si tiene un asunto urgente cuando la clnica est cerrada y que no puede esperar hasta el siguiente da hbil, puede llamar/localizar a su doctor(a) al nmero que aparece a continuacin.   Por favor, tenga en cuenta que aunque hacemos todo lo posible para estar disponibles para asuntos urgentes fuera del horario de oficina, no estamos  disponibles las 24 horas del da, los 7 das de la semana.   Si tiene un problema urgente y no puede comunicarse con nosotros, puede optar por buscar atencin mdica  en el consultorio de su doctor(a), en una clnica privada, en un centro de atencin urgente o en una sala de emergencias.  Si tiene una emergencia mdica, por favor llame inmediatamente al 911 o vaya a la sala de emergencias.  Nmeros de bper  - Dr. Kowalski: [phone]  - Dra. Moye: [phone]  - Dra. Stewart: [phone]  En caso de inclemencias del tiempo, por favor llame a nuestra lnea principal al [phone] para una actualizacin sobre el estado de cualquier retraso o cierre.  Consejos para la medicacin en dermatologa: Por favor, guarde las cajas en las que vienen los medicamentos de uso tpico para ayudarle a seguir las instrucciones sobre dnde y cmo usarlos. Las farmacias generalmente imprimen las instrucciones del medicamento slo en las cajas y   no directamente en los tubos del medicamento.   Si su medicamento es muy caro, por favor, pngase en contacto con nuestra oficina llamando al [phone] y presione la opcin 4 o envenos un mensaje a travs de MyChart.   No podemos decirle cul ser su copago por los medicamentos por adelantado ya que esto es diferente dependiendo de la cobertura de su seguro. Sin embargo, es posible que podamos encontrar un medicamento sustituto a menor costo o llenar un formulario para que el seguro cubra el medicamento que se considera necesario.   Si se requiere una autorizacin previa para que su compaa de seguros cubra su medicamento, por favor permtanos de 1 a 2 das hbiles para completar este proceso.  Los precios de los medicamentos varan con frecuencia dependiendo del lugar de dnde se surte la receta y alguna farmacias pueden ofrecer precios ms baratos.  El sitio web www.goodrx.com tiene cupones para medicamentos de diferentes farmacias. Los precios aqu no  tienen en cuenta lo que podra costar con la ayuda del seguro (puede ser ms barato con su seguro), pero el sitio web puede darle el precio si no utiliz ningn seguro.  - Puede imprimir el cupn correspondiente y llevarlo con su receta a la farmacia.  - Tambin puede pasar por nuestra oficina durante el horario de atencin regular y recoger una tarjeta de cupones de GoodRx.  - Si necesita que su receta se enve electrnicamente a una farmacia diferente, informe a nuestra oficina a travs de MyChart de [place] o por telfono llamando al [phone] y presione la opcin 4.  

## 2022-08-03 NOTE — Progress Notes (Signed)
Follow-Up Visit   Subjective  Curtis Combs is a 62 y.o. male who presents for the following: Actinic keratosis. 6 months f/u on precancers on the face, arms,hands. The patient has spots on his legs and hands to be evaluated, some may be new or changing and the patient has concerns that these could be cancer.   The following portions of the chart were reviewed this encounter and updated as appropriate: medications, allergies, medical history  Review of Systems:  No other skin or systemic complaints except as noted in HPI or Assessment and Plan.  Objective  Well appearing patient in no apparent distress; mood and affect are within normal limits.  A focused examination was performed of the following areas:      Relevant exam findings are noted in the Assessment and Plan.  right calf 0.5 cm irregular papule   right dorsum hand 0.5 cm irregular papule    Assessment & Plan   Neoplasm of skin (2) right calf  Epidermal / dermal shaving  Lesion diameter (cm):  0.5 Informed consent: discussed and consent obtained   Timeout: patient name, date of birth, surgical site, and procedure verified   Anesthesia: the lesion was anesthetized in a standard fashion   Anesthetic:  1% lidocaine w/ epinephrine 1-100,000 local infiltration Instrument used: flexible razor blade   Hemostasis achieved with: aluminum chloride   Outcome: patient tolerated procedure well   Post-procedure details: wound care instructions given   Additional details:  Mupirocin and a bandage applied  Destruction of lesion  Destruction method: electrodesiccation and curettage   Informed consent: discussed and consent obtained   Timeout:  patient name, date of birth, surgical site, and procedure verified Anesthesia: the lesion was anesthetized in a standard fashion   Anesthetic:  1% lidocaine w/ epinephrine 1-100,000 buffered w/ 8.4% NaHCO3 Curettage performed in three different directions: Yes    Electrodesiccation performed over the curetted area: Yes   Curettage cycles:  3 Lesion length (cm):  0.5 Lesion width (cm):  0.5 Margin per side (cm):  0.2 Final wound size (cm):  0.9 Hemostasis achieved with:  electrodesiccation Outcome: patient tolerated procedure well with no complications   Post-procedure details: sterile dressing applied and wound care instructions given   Dressing type: petrolatum    Specimen 1 - Surgical pathology Differential Diagnosis: R/O Skin cancer   Check Margins: No  right dorsum hand  Epidermal / dermal shaving  Lesion diameter (cm):  0.5 Informed consent: discussed and consent obtained   Timeout: patient name, date of birth, surgical site, and procedure verified   Anesthesia: the lesion was anesthetized in a standard fashion   Anesthetic:  1% lidocaine w/ epinephrine 1-100,000 local infiltration Instrument used: flexible razor blade   Hemostasis achieved with: aluminum chloride   Outcome: patient tolerated procedure well   Post-procedure details: wound care instructions given   Additional details:  Mupirocin and a bandage applied  Destruction of lesion Complexity: extensive   Destruction method: electrodesiccation and curettage   Informed consent: discussed and consent obtained   Timeout:  patient name, date of birth, surgical site, and procedure verified Procedure prep:  Patient was prepped and draped in usual sterile fashion Prep type:  Isopropyl alcohol Anesthesia: the lesion was anesthetized in a standard fashion   Anesthetic:  1% lidocaine w/ epinephrine 1-100,000 buffered w/ 8.4% NaHCO3 Curettage performed in three different directions: Yes   Electrodesiccation performed over the curetted area: Yes   Lesion length (cm):  0.5 Lesion width (cm):  0.5 Margin per side (cm):  0.2 Final wound size (cm):  0.9 Hemostasis achieved with:  pressure, aluminum chloride and electrodesiccation Outcome: patient tolerated procedure well with no  complications   Post-procedure details: sterile dressing applied and wound care instructions given   Dressing type: bandage and petrolatum    Specimen 2 - Surgical pathology Differential Diagnosis: R/O Skin cancer   Check Margins: No  ACTINIC KERATOSIS  Exam: Erythematous thin papules/macules with gritty scale  Actinic keratoses are precancerous spots that appear secondary to cumulative UV radiation exposure/sun exposure over time. They are chronic with expected duration over 1 year. A portion of actinic keratoses will progress to squamous cell carcinoma of the skin. It is not possible to reliably predict which spots will progress to skin cancer and so treatment is recommended to prevent development of skin cancer.  Recommend daily broad spectrum sunscreen SPF 30+ to sun-exposed areas, reapply every 2 hours as needed.  Recommend staying in the shade or wearing long sleeves, sun glasses (UVA+UVB protection) and wide brim hats (4-inch brim around the entire circumference of the hat). Call for new or changing lesions.  Treatment Plan: Destruction Procedure Note Destruction method: cryotherapy   Informed consent: discussed and consent obtained   Lesion destroyed using liquid nitrogen: Yes   Outcome: patient tolerated procedure well with no complications   Post-procedure details: wound care instructions given   Locations: 1 # of Lesions Treated: left temple   Prior to procedure, discussed risks of blister formation, small wound, skin dyspigmentation, or rare scar following cryotherapy. Recommend Vaseline ointment to treated areas while healing.   ACTINIC DAMAGE WITH PRECANCEROUS ACTINIC KERATOSES Counseling for Topical Chemotherapy Management: Patient exhibits: - Severe, confluent actinic changes with pre-cancerous actinic keratoses that is secondary to cumulative UV radiation exposure over time - Condition that is severe; chronic, not at goal. - diffuse scaly erythematous macules and  papules with underlying dyspigmentation - Discussed Prescription "Field Treatment" topical Chemotherapy for Severe, Chronic Confluent Actinic Changes with Pre-Cancerous Actinic Keratoses  Restart 5FU/Calcipotriene cream apply to forehead, temples and dorsum hands twice a day x 7 days   Field treatment involves treatment of an entire area of skin that has confluent Actinic Changes (Sun/ Ultraviolet light damage) and PreCancerous Actinic Keratoses by method of PhotoDynamic Therapy (PDT) and/or prescription Topical Chemotherapy agents such as 5-fluorouracil, 5-fluorouracil/calcipotriene, and/or imiquimod.  The purpose is to decrease the number of clinically evident and subclinical PreCancerous lesions to prevent progression to development of skin cancer by chemically destroying early precancer changes that may or may not be visible.  It has been shown to reduce the risk of developing skin cancer in the treated area. As a result of treatment, redness, scaling, crusting, and open sores may occur during treatment course. One or more than one of these methods may be used and may have to be used several times to control, suppress and eliminate the PreCancerous changes. Discussed treatment course, expected reaction, and possible side effects. - Recommend daily broad spectrum sunscreen SPF 30+ to sun-exposed areas, reapply every 2 hours as needed.  - Staying in the shade or wearing long sleeves, sun glasses (UVA+UVB protection) and wide brim hats (4-inch brim around the entire circumference of the hat) are also recommended. - Call for new or changing lesions.   INFLAMED SEBORRHEIC KERATOSIS Exam: Erythematous keratotic or waxy stuck-on papule or plaque.  Symptomatic, irritating, patient would like treated.  Benign-appearing.  Call clinic for new or changing lesions.   Prior to procedure, discussed  risks of blister formation, small wound, skin dyspigmentation, or rare scar following treatment. Recommend  Vaseline ointment to treated areas while healing.  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 [facility]  Right proximal 4th finger. [facility] 11/18/2021  - No evidence of recurrence today - No lymphadenopathy - Recommend regular full body skin exams - Recommend daily broad spectrum sunscreen SPF 30+ to sun-exposed areas, reapply every 2 hours as needed.  - Call if any new or changing lesions are noted between office visits  Return in about 6 months (around 02/03/2023) for AKs .  IAngelique Holm, Monika Farrish, CMA, am acting as scribe for Armida Sansavid Toyoko Silos, MD .   Documentation: I have reviewed the above documentation for accuracy and completeness, and I agree with the above.  Armida Sansavid Lewie Deman, MD

## 2022-08-11 ENCOUNTER — Telehealth: Payer: Self-pay

## 2022-08-11 NOTE — Telephone Encounter (Signed)
-----   Message from Ralene Bathe, MD sent at 08/10/2022  2:29 PM EDT ----- Diagnosis 1. Skin , right calf SQUAMOUS CELL CARCINOMA, KERATOACANTHOMA TYPE 2. 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

## 2022-08-13 ENCOUNTER — Encounter: Payer: Self-pay | Admitting: Dermatology

## 2022-08-16 ENCOUNTER — Telehealth: Payer: Self-pay

## 2022-08-16 NOTE — Telephone Encounter (Signed)
Advised pt of bx results/sh ?

## 2022-08-16 NOTE — Telephone Encounter (Signed)
-----   Message from David C Kowalski, MD sent at 08/10/2022  2:29 PM EDT ----- Diagnosis 1. Skin , right calf SQUAMOUS CELL CARCINOMA, KERATOACANTHOMA TYPE 2. 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 

## 2023-02-09 ENCOUNTER — Encounter: Payer: Self-pay | Admitting: Dermatology

## 2023-02-09 ENCOUNTER — Ambulatory Visit: Payer: 59 | Admitting: Dermatology

## 2023-02-09 VITALS — BP 136/89

## 2023-02-09 DIAGNOSIS — L578 Other skin changes due to chronic exposure to nonionizing radiation: Secondary | ICD-10-CM

## 2023-02-09 DIAGNOSIS — W908XXA Exposure to other nonionizing radiation, initial encounter: Secondary | ICD-10-CM | POA: Diagnosis not present

## 2023-02-09 DIAGNOSIS — Z85828 Personal history of other malignant neoplasm of skin: Secondary | ICD-10-CM | POA: Diagnosis not present

## 2023-02-09 DIAGNOSIS — L570 Actinic keratosis: Secondary | ICD-10-CM

## 2023-02-09 DIAGNOSIS — Z8589 Personal history of malignant neoplasm of other organs and systems: Secondary | ICD-10-CM

## 2023-02-09 DIAGNOSIS — L821 Other seborrheic keratosis: Secondary | ICD-10-CM | POA: Diagnosis not present

## 2023-02-09 DIAGNOSIS — Z86007 Personal history of in-situ neoplasm of skin: Secondary | ICD-10-CM

## 2023-02-09 NOTE — Patient Instructions (Addendum)

## 2023-02-09 NOTE — Progress Notes (Signed)
Follow-Up Visit   Subjective  Curtis Combs is a 62 y.o. male who presents for the following: AK 26m f/u, used 5FU/Calcipitriene cr to forehead, temples and dorsum hands, recheck SCC R calf, SCC IS R dorsum hand The patient has spots, moles and lesions to be evaluated, some may be new or changing and the patient may have concern these could be cancer.   The following portions of the chart were reviewed this encounter and updated as appropriate: medications, allergies, medical history  Review of Systems:  No other skin or systemic complaints except as noted in HPI or Assessment and Plan.  Objective  Well appearing patient in no apparent distress; mood and affect are within normal limits.   A focused examination was performed of the following areas: Face, hands, R leg  Relevant exam findings are noted in the Assessment and Plan.  R dorsum hand x 6, L dorsum hand x 7, face x 5, R calf x 1 (19) Pink scaly macules    Assessment & Plan   HISTORY OF SQUAMOUS CELL CARCINOMA OF THE SKIN - No evidence of recurrence today - No lymphadenopathy - Recommend regular full body skin exams - Recommend daily broad spectrum sunscreen SPF 30+ to sun-exposed areas, reapply every 2 hours as needed.  - Call if any new or changing lesions are noted between office visits - R calf  HISTORY OF SQUAMOUS CELL CARCINOMA IN SITU OF THE SKIN - No evidence of recurrence today - Recommend regular full body skin exams - Recommend daily broad spectrum sunscreen SPF 30+ to sun-exposed areas, reapply every 2 hours as needed.  - Call if any new or changing lesions are noted between office visits  - R dorsum hand  SEBORRHEIC KERATOSIS legs - Stuck-on, waxy, tan-brown papules and/or plaques  - Benign-appearing - Discussed benign etiology and prognosis. - Observe - Call for any changes  AK (actinic keratosis) (19) R dorsum hand x 6, L dorsum hand x 7, face x 5, R calf x 1  Actinic keratoses are  precancerous spots that appear secondary to cumulative UV radiation exposure/sun exposure over time. They are chronic with expected duration over 1 year. A portion of actinic keratoses will progress to squamous cell carcinoma of the skin. It is not possible to reliably predict which spots will progress to skin cancer and so treatment is recommended to prevent development of skin cancer.  Recommend daily broad spectrum sunscreen SPF 30+ to sun-exposed areas, reapply every 2 hours as needed.  Recommend staying in the shade or wearing long sleeves, sun glasses (UVA+UVB protection) and wide brim hats (4-inch brim around the entire circumference of the hat). Call for new or changing lesions.  Destruction of lesion - R dorsum hand x 6, L dorsum hand x 7, face x 5, R calf x 1 (19) Complexity: simple   Destruction method: cryotherapy   Informed consent: discussed and consent obtained   Timeout:  patient name, date of birth, surgical site, and procedure verified Lesion destroyed using liquid nitrogen: Yes   Region frozen until ice ball extended beyond lesion: Yes   Outcome: patient tolerated procedure well with no complications   Post-procedure details: wound care instructions given     ACTINIC DAMAGE - chronic, secondary to cumulative UV radiation exposure/sun exposure over time - diffuse scaly erythematous macules with underlying dyspigmentation - Recommend daily broad spectrum sunscreen SPF 30+ to sun-exposed areas, reapply every 2 hours as needed.  - Recommend staying in the shade or wearing  long sleeves, sun glasses (UVA+UVB protection) and wide brim hats (4-inch brim around the entire circumference of the hat). - Call for new or changing lesions.      Return in about 6 months (around 08/10/2023) for UBSE, Hx of SCC, Hx of SCC IS, Hx of BCC, Hx of AKs.  I, Ardis Rowan, RMA, am acting as scribe for Armida Sans, MD .   Documentation: I have reviewed the above documentation for accuracy  and completeness, and I agree with the above.  Armida Sans, MD

## 2023-02-10 ENCOUNTER — Encounter: Payer: Self-pay | Admitting: Dermatology

## 2023-05-12 ENCOUNTER — Encounter: Payer: Self-pay | Admitting: Family Medicine

## 2023-05-12 ENCOUNTER — Ambulatory Visit: Payer: 59 | Admitting: Family Medicine

## 2023-05-12 VITALS — BP 125/94 | HR 75 | Ht 65.0 in | Wt 183.5 lb

## 2023-05-12 DIAGNOSIS — Z0001 Encounter for general adult medical examination with abnormal findings: Secondary | ICD-10-CM | POA: Diagnosis not present

## 2023-05-12 DIAGNOSIS — Z0000 Encounter for general adult medical examination without abnormal findings: Secondary | ICD-10-CM

## 2023-05-12 DIAGNOSIS — E782 Mixed hyperlipidemia: Secondary | ICD-10-CM

## 2023-05-12 DIAGNOSIS — M79605 Pain in left leg: Secondary | ICD-10-CM | POA: Diagnosis not present

## 2023-05-12 DIAGNOSIS — R739 Hyperglycemia, unspecified: Secondary | ICD-10-CM

## 2023-05-12 DIAGNOSIS — R5383 Other fatigue: Secondary | ICD-10-CM

## 2023-05-12 DIAGNOSIS — Z125 Encounter for screening for malignant neoplasm of prostate: Secondary | ICD-10-CM

## 2023-05-12 DIAGNOSIS — I10 Essential (primary) hypertension: Secondary | ICD-10-CM | POA: Diagnosis not present

## 2023-05-12 DIAGNOSIS — R109 Unspecified abdominal pain: Secondary | ICD-10-CM | POA: Diagnosis not present

## 2023-05-12 NOTE — Progress Notes (Signed)
 Complete physical exam  Patient: Curtis Combs   DOB: 17-Jan-1961   63 y.o. Male  MRN: [id number]  Introduced to nurse practitioner role and practice setting.  All questions answered.  Discussed provider/patient relationship and expectations.   Subjective:    Chief Complaint  Patient presents with   Annual Exam    Last completed 08/03/21 Diet - Carnivore diet (high protein) Exercise - none Feeling -  well Sleeping- pretty good Concern - no energy, random aggravating pain when positioned different ways while sitting (reminds him of sciatic nerve)   Curtis Combs is a 63 y.o. male who presents today for a complete physical exam. He reports consuming a  carnivore  high animal protein  diet. The patient does not participate in regular exercise at present. He generally feels fairly well. He reports sleeping well. He does have additional problems to discuss today.   Concerns: general fatigue and intermittent radiating pain in the leg and hip area. 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 The pain is intermittent, occurring a few times a week, and tends to subside upon movement or walking.  Fatigue- He has been following a carnivore diet for approximately four months, during which he lost about thirty pounds. However, he reports feeling a lack of energy despite taking a daily vitamin and recently starting magnesium supplements, feels his diet may not be providing him all the vitamins for energy.  History of kidney stones and occasionally experiences pain in R flank, which he attributes to this condition. He has undergone two lithotripsies.   Hx - HTN and use of blood pressure medication, which was discontinued due to lightheadedness. The patient monitors his blood pressure at home, which tends to be 120s/80s.  Sees dental every 6  months and wears prescription glasses, sees optho.   Exercise - will walk and sometimes jog.  Most recent fall risk assessment:    05/12/2023    3:52 PM  Fall Risk   Falls in the past year? 0  Number falls in past yr: 0  Injury with Fall? 0  Risk for fall due to : No Fall Risks  Follow up Falls evaluation completed     Most recent depression screenings:    05/12/2023    3:51 PM 08/03/2021    3:05 PM  PHQ 2/9 Scores  PHQ - 2 Score 0 0    PSA: Agrees to PSA testing  Patient Active Problem List   Diagnosis Date Noted   Orthostatic dizziness 09/02/2021   Episodic lightheadedness 09/02/2021   Annual physical exam 08/03/2021   Elevated serum glucose 08/03/2021   Prostate cancer screening 08/03/2021   Mixed hyperlipidemia 08/03/2021   Seasonal allergic rhinitis due to pollen 08/03/2021   HSV (herpes simplex virus) infection 08/03/2021   Hypertension 07/04/2017   Past Medical History:  Diagnosis Date   Actinic keratosis 11/19/2018   left mid lat calf superior   Basal cell carcinoma 03/10/2008   sup ant deltoid   History of basal cell carcinoma (BCC) 07/24/2019   right scalp postauricular   SCC (squamous cell carcinoma) 11/2011   left dorsum hand treated with [facility] 11/18/21   SCC (squamous cell carcinoma) 08/03/2022   R calf, [facility]   SCC (squamous cell carcinoma) 08/03/2022   SCC IS R dorsum hand, [facility]   Squamous cell carcinoma in  situ    left forearm dorsum near elbow, left dorsum hand   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   left dorsum hand : squamous cell carcinoma in situ hypertrophic   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   cetirizine  (ZYRTEC ) 10 MG tablet Take 1 tablet (10 mg total) by mouth daily.   fluticasone  (FLONASE ) 50 MCG/ACT nasal spray Place 2 sprays into both nostrils daily.   valACYclovir  (VALTREX ) 1000 MG tablet Take 1 tablet (1,000 mg total) by mouth 2 (two) times daily.   No facility-administered medications prior to visit.   Review of Systems  All other systems reviewed and are negative.     Objective:     BP (!) 125/94 (BP Location: Left Arm, Patient Position: Sitting, Cuff Size: Normal)   Pulse 75   Ht 5' 5 (1.651 m)   Wt 183 lb 8 oz (83.2 kg)   SpO2 99%   BMI 30.54 kg/m    Physical Exam Constitutional:      General: He is not in acute distress.    Appearance: Normal appearance. He is normal weight. He is not ill-appearing, toxic-appearing or diaphoretic.  HENT:     Head: Normocephalic.     Right Ear: Tympanic membrane normal.     Left Ear: Tympanic membrane normal.     Nose: Nose normal.     Mouth/Throat:     Mouth: Mucous membranes are moist.     Pharynx: Oropharynx is clear. No oropharyngeal exudate or posterior oropharyngeal erythema.  Eyes:     Extraocular Movements: Extraocular movements intact.     Conjunctiva/sclera: Conjunctivae normal.     Pupils: Pupils are equal, round, and reactive to light.  Neck:     Thyroid : No thyroid  mass, thyromegaly or thyroid  tenderness.  Cardiovascular:     Rate and Rhythm: Normal rate.     Pulses: Normal pulses.     Heart sounds: Normal heart sounds. No murmur heard.    No friction rub. No gallop.  Pulmonary:     Effort: Pulmonary effort is normal. No respiratory distress.     Breath sounds: Normal breath sounds. No stridor. No wheezing, rhonchi or rales.  Abdominal:     General: Bowel sounds are normal. There is no distension.      Palpations: Abdomen is soft. There is no mass.     Tenderness: There is no abdominal tenderness. There is right CVA tenderness. There is no left CVA tenderness, guarding or rebound.     Hernia: No hernia is present.     Comments: Mild tenderness to R CVA  Musculoskeletal:        General: No swelling or tenderness. Normal range of motion.     Cervical back: Normal range of motion. No rigidity.  Lymphadenopathy:     Cervical: No cervical adenopathy.  Skin:    General: Skin is  warm and dry.     Capillary Refill: Capillary refill takes less than 2 seconds.     Findings: No bruising or erythema.  Neurological:     General: No focal deficit present.     Mental Status: He is alert and oriented to person, place, and time. Mental status is at baseline.     GCS: GCS eye subscore is 4. GCS verbal subscore is 5. GCS motor subscore is 6.     Cranial Nerves: No cranial nerve deficit.     Sensory: No sensory deficit.     Motor: Motor function is intact.     Coordination: Coordination is intact.     Gait: Gait is intact.  Psychiatric:        Mood and Affect: Mood normal.        Behavior: Behavior normal.        Thought Content: Thought content normal.        Judgment: Judgment normal.      Results for orders placed or performed in visit on 05/12/23  PSA  Result Value Ref Range   Prostate Specific Ag, Serum 0.7 0.0 - 4.0 ng/mL  Lipid Panel With LDL/HDL Ratio  Result Value Ref Range   Cholesterol, Total 194 100 - 199 mg/dL   Triglycerides 58 0 - 149 mg/dL   HDL 62 >60 mg/dL   VLDL Cholesterol Cal 11 5 - 40 mg/dL   LDL Chol Calc (NIH) 878 (H) 0 - 99 mg/dL   LDL/HDL Ratio 2.0 0.0 - 3.6 ratio  Comprehensive metabolic panel  Result Value Ref Range   Glucose 92 70 - 99 mg/dL   BUN 19 8 - 27 mg/dL   Creatinine, Ser 8.87 0.76 - 1.27 mg/dL   eGFR 75 >40 fO/fpw/8.26   BUN/Creatinine Ratio 17 10 - 24   Sodium 140 134 - 144 mmol/L   Potassium 4.8 3.5 - 5.2 mmol/L   Chloride 103 96 - 106  mmol/L   CO2 23 20 - 29 mmol/L   Calcium  9.5 8.6 - 10.2 mg/dL   Total Protein 7.0 6.0 - 8.5 g/dL   Albumin 4.3 3.9 - 4.9 g/dL   Globulin, Total 2.7 1.5 - 4.5 g/dL   Bilirubin Total 0.6 0.0 - 1.2 mg/dL   Alkaline Phosphatase 88 44 - 121 IU/L   AST 24 0 - 40 IU/L   ALT 23 0 - 44 IU/L  Hemoglobin A1c  Result Value Ref Range   Hgb A1c MFr Bld 5.9 (H) 4.8 - 5.6 %   Est. average glucose Bld gHb Est-mCnc 123 mg/dL  Vitamin A87  Result Value Ref Range   Vitamin B-12 564 232 - 1,245 pg/mL  VITAMIN D  25 Hydroxy (Vit-D Deficiency, Fractures)  Result Value Ref Range   Vit D, 25-Hydroxy 36.4 30.0 - 100.0 ng/mL  CBC with Differential  Result Value Ref Range   WBC 9.7 3.4 - 10.8 x10E3/uL   RBC 5.24 4.14 - 5.80 x10E6/uL   Hemoglobin 15.7 13.0 - 17.7 g/dL   Hematocrit 52.0 62.4 - 51.0 %   MCV 91 79 - 97 fL   MCH 30.0 26.6 - 33.0 pg   MCHC 32.8 31.5 - 35.7 g/dL   RDW 87.3 88.3 - 84.5 %   Platelets 283 150 - 450 x10E3/uL   Neutrophils 52 Not Estab. %   Lymphs 36 Not Estab. %   Monocytes 8 Not Estab. %   Eos 3 Not Estab. %   Basos 1 Not Estab. %  Neutrophils Absolute 5.1 1.4 - 7.0 x10E3/uL   Lymphocytes Absolute 3.5 (H) 0.7 - 3.1 x10E3/uL   Monocytes Absolute 0.8 0.1 - 0.9 x10E3/uL   EOS (ABSOLUTE) 0.2 0.0 - 0.4 x10E3/uL   Basophils Absolute 0.1 0.0 - 0.2 x10E3/uL   Immature Granulocytes 0 Not Estab. %   Immature Grans (Abs) 0.0 0.0 - 0.1 x10E3/uL  TSH  Result Value Ref Range   TSH 1.750 0.450 - 4.500 uIU/mL       Assessment & Plan:    Routine Health Maintenance and Physical Exam  Health Maintenance  Topic Date Due   COVID-19 Vaccine (1) Never done   Cologuard (Stool DNA test)  10/19/2023   DTaP/Tdap/Td vaccine (3 - Td or Tdap) 12/20/2028   Flu Shot  Completed   Hepatitis C Screening  Completed   HIV Screening  Completed   Zoster (Shingles) Vaccine  Completed   HPV Vaccine  Aged Out    Discussed health benefits of physical activity, and encouraged him to engage in  regular exercise appropriate for his age and condition.  Annual physical exam Assessment & Plan: Annual physical today Routine labs Concerns addressed today: fatigue, L leg intermittent ache, and mild R CVA tenderness   Primary hypertension Assessment & Plan: Chronic, not a medication d/t hx of lightheadedness on medications.  Goal SBP<120; DBP<80. Concerning DBP today in 90s.  -Monitor blood pressure daily at home with automatic upper arm cuff. -Encourage increased exercise, water intake, and decreased sodium intake. -Follow up in 4 weeks to reassess blood pressure and consider restarting antihypertensive medication if necessary.  Orders: -     Comprehensive metabolic panel  Mixed hyperlipidemia Assessment & Plan: Hx elevated LDL, pt currently on a mainly animal/red meat diet.  - Lipid panel today - Encouraged less saturated, mammal fats, increase fish, nuts, yogurts, vegetables. - Exercise  Orders: -     Lipid Panel With LDL/HDL Ratio  Elevated serum glucose Assessment & Plan: Hx of elevated A1C and HTN, BMI 30.54, will recheck today.  - Encouraged small frequent meals. Increase vegetables, fruits, and proteins - Increase weekly, purposeful exercise 20-30 minutes per day.   Orders: -     Comprehensive metabolic panel -     Hemoglobin A1c  Left leg pain Assessment & Plan: Intermittent pain, exacerbated by prolonged sitting, relieved by movement. 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 No history of injury. -Recommend daily stretching of back and legs. -Advise use of heat/ice and topical analgesics (Bengay, Tiger Balm). -Suggest over-the-counter pain relief with Tylenol  or ibuprofen. -Consider imaging or orthopedic consultation if symptoms worsen or become daily.   Fatigue, unspecified type Assessment & Plan: Increased fatigue - Encouraged multivitamin - Will check TSH, CBC, Vitamin D  - Increase water intake - Decrease purely mammal protein  diet, recommend increasing balanced diet, vegetables, fruits, fish, nuts. - Encourage daily exercise and sleep hygiene  Orders: -     Vitamin B12 -     VITAMIN D  25 Hydroxy (Vit-D Deficiency, Fractures) -     CBC with Differential/Platelet -     TSH  Right flank pain Assessment & Plan: Hx of kidney stones, mild tenderness to R CVA, will check urinalysis today.  Denies blood in urine, fevers, chills, dysuria.  Orders: -     Urinalysis w microscopic + reflex cultur  Prostate cancer screening -     PSA     Return in about 4 weeks (around 06/09/2023) for blood pressure .  I, Indica Marcott  DELENA Boom, FNP, have reviewed all documentation for this visit. The documentation on 05/13/23 for the exam, diagnosis, procedures, and orders are all accurate and complete.      Curtis DELENA Boom, FNP

## 2023-05-13 ENCOUNTER — Encounter: Payer: Self-pay | Admitting: Family Medicine

## 2023-05-13 DIAGNOSIS — R109 Unspecified abdominal pain: Secondary | ICD-10-CM | POA: Insufficient documentation

## 2023-05-13 DIAGNOSIS — M79605 Pain in left leg: Secondary | ICD-10-CM | POA: Insufficient documentation

## 2023-05-13 DIAGNOSIS — R5383 Other fatigue: Secondary | ICD-10-CM | POA: Insufficient documentation

## 2023-05-13 LAB — CBC WITH DIFFERENTIAL/PLATELET
Basophils Absolute: 0.1 10*3/uL (ref 0.0–0.2)
Basos: 1 %
EOS (ABSOLUTE): 0.2 10*3/uL (ref 0.0–0.4)
Eos: 3 %
Hematocrit: 47.9 % (ref 37.5–51.0)
Hemoglobin: 15.7 g/dL (ref 13.0–17.7)
Immature Grans (Abs): 0 10*3/uL (ref 0.0–0.1)
Immature Granulocytes: 0 %
Lymphocytes Absolute: 3.5 10*3/uL — ABNORMAL HIGH (ref 0.7–3.1)
Lymphs: 36 %
MCH: 30 pg (ref 26.6–33.0)
MCHC: 32.8 g/dL (ref 31.5–35.7)
MCV: 91 fL (ref 79–97)
Monocytes Absolute: 0.8 10*3/uL (ref 0.1–0.9)
Monocytes: 8 %
Neutrophils Absolute: 5.1 10*3/uL (ref 1.4–7.0)
Neutrophils: 52 %
Platelets: 283 10*3/uL (ref 150–450)
RBC: 5.24 x10E6/uL (ref 4.14–5.80)
RDW: 12.6 % (ref 11.6–15.4)
WBC: 9.7 10*3/uL (ref 3.4–10.8)

## 2023-05-13 LAB — COMPREHENSIVE METABOLIC PANEL
ALT: 23 [IU]/L (ref 0–44)
AST: 24 [IU]/L (ref 0–40)
Albumin: 4.3 g/dL (ref 3.9–4.9)
Alkaline Phosphatase: 88 [IU]/L (ref 44–121)
BUN/Creatinine Ratio: 17 (ref 10–24)
BUN: 19 mg/dL (ref 8–27)
Bilirubin Total: 0.6 mg/dL (ref 0.0–1.2)
CO2: 23 mmol/L (ref 20–29)
Calcium: 9.5 mg/dL (ref 8.6–10.2)
Chloride: 103 mmol/L (ref 96–106)
Creatinine, Ser: 1.12 mg/dL (ref 0.76–1.27)
Globulin, Total: 2.7 g/dL (ref 1.5–4.5)
Glucose: 92 mg/dL (ref 70–99)
Potassium: 4.8 mmol/L (ref 3.5–5.2)
Sodium: 140 mmol/L (ref 134–144)
Total Protein: 7 g/dL (ref 6.0–8.5)
eGFR: 75 mL/min/{1.73_m2} (ref >59)

## 2023-05-13 LAB — LIPID PANEL WITH LDL/HDL RATIO
Cholesterol, Total: 194 mg/dL (ref 100–199)
HDL: 62 mg/dL (ref >39)
LDL Chol Calc (NIH): 121 mg/dL — ABNORMAL HIGH (ref 0–99)
LDL/HDL Ratio: 2 {ratio} (ref 0.0–3.6)
Triglycerides: 58 mg/dL (ref 0–149)
VLDL Cholesterol Cal: 11 mg/dL (ref 5–40)

## 2023-05-13 LAB — HEMOGLOBIN A1C
Est. average glucose Bld gHb Est-mCnc: 123 mg/dL
Hgb A1c MFr Bld: 5.9 % — ABNORMAL HIGH (ref 4.8–5.6)

## 2023-05-13 LAB — TSH: TSH: 1.75 u[IU]/mL (ref 0.450–4.500)

## 2023-05-13 LAB — VITAMIN B12: Vitamin B-12: 564 pg/mL (ref 232–1245)

## 2023-05-13 LAB — PSA: Prostate Specific Ag, Serum: 0.7 ng/mL (ref 0.0–4.0)

## 2023-05-13 LAB — VITAMIN D 25 HYDROXY (VIT D DEFICIENCY, FRACTURES): Vit D, 25-Hydroxy: 36.4 ng/mL (ref 30.0–100.0)

## 2023-05-13 NOTE — Assessment & Plan Note (Addendum)
 Increased fatigue - Encouraged multivitamin - Will check TSH, CBC, Vitamin D  - Increase water intake - Decrease purely mammal protein diet, recommend increasing balanced diet, vegetables, fruits, fish, nuts. - Encourage daily exercise and sleep hygiene

## 2023-05-13 NOTE — Assessment & Plan Note (Signed)
 Hx elevated LDL, pt currently on a mainly animal/red meat diet.  - Lipid panel today - Encouraged less saturated, mammal fats, increase fish, nuts, yogurts, vegetables. - Exercise

## 2023-05-13 NOTE — Assessment & Plan Note (Signed)
 Annual physical today Routine labs Concerns addressed today: fatigue, L leg intermittent ache, and mild R CVA tenderness

## 2023-05-13 NOTE — Assessment & Plan Note (Signed)
 Intermittent pain, exacerbated by prolonged sitting, relieved by movement. 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 No history of injury. -Recommend daily stretching of back and legs. -Advise use of heat/ice and topical analgesics (Bengay, Tiger Balm). -Suggest over-the-counter pain relief with Tylenol  or ibuprofen. -Consider imaging or orthopedic consultation if symptoms worsen or become daily.

## 2023-05-13 NOTE — Assessment & Plan Note (Signed)
 Hx of elevated A1C and HTN, BMI 30.54, will recheck today.  - Encouraged small frequent meals. Increase vegetables, fruits, and proteins - Increase weekly, purposeful exercise 20-30 minutes per day.

## 2023-05-13 NOTE — Assessment & Plan Note (Signed)
 Chronic, not a medication d/t hx of lightheadedness on medications.  Goal SBP<120; DBP<80. Concerning DBP today in 90s.  -Monitor blood pressure daily at home with automatic upper arm cuff. -Encourage increased exercise, water intake, and decreased sodium intake. -Follow up in 4 weeks to reassess blood pressure and consider restarting antihypertensive medication if necessary.

## 2023-05-13 NOTE — Assessment & Plan Note (Signed)
 Hx of kidney stones, mild tenderness to R CVA, will check urinalysis today.  Denies blood in urine, fevers, chills, dysuria.

## 2023-05-16 ENCOUNTER — Ambulatory Visit: Payer: 59 | Admitting: Family Medicine

## 2023-05-16 ENCOUNTER — Encounter: Payer: 59 | Admitting: Family Medicine

## 2023-06-12 ENCOUNTER — Encounter: Payer: Self-pay | Admitting: Family Medicine

## 2023-06-12 ENCOUNTER — Ambulatory Visit: Payer: 59 | Admitting: Family Medicine

## 2023-06-12 VITALS — BP 124/97 | HR 73 | Resp 14 | Ht 65.0 in | Wt 186.5 lb

## 2023-06-12 DIAGNOSIS — R002 Palpitations: Secondary | ICD-10-CM | POA: Insufficient documentation

## 2023-06-12 DIAGNOSIS — R42 Dizziness and giddiness: Secondary | ICD-10-CM | POA: Diagnosis not present

## 2023-06-12 DIAGNOSIS — I10 Essential (primary) hypertension: Secondary | ICD-10-CM | POA: Diagnosis not present

## 2023-06-12 DIAGNOSIS — R55 Syncope and collapse: Secondary | ICD-10-CM

## 2023-06-12 MED ORDER — LISINOPRIL 5 MG PO TABS: 2.5000 mg | ORAL_TABLET | Freq: Every day | ORAL | 0 refills | Status: DC

## 2023-06-12 NOTE — Assessment & Plan Note (Addendum)
Intermittent lightheadedness with positional changes Resolves when sitting down Had syncopal episode 3 months ago - first discoursed to provider today Was previously worked up by [facility] two years ago, amlodipine was stopped per patient Otherwise w/u appears to have normal findings in 2023. EKG today showed NSR with x1 PAC - otherwise normal rhythm Decrease to eliminate caffeine intake Increase water intake Zio patch ordered for at home monitoring Referral to [facility] urgent placed

## 2023-06-12 NOTE — Assessment & Plan Note (Addendum)
Persistent elevation of diastolic blood pressure despite lifestyle modifications.  At home readings  SBP=100s-130s; DBP=80s-90s No current medications. -Given hx lightheadedness and syncopal episode 3 months ago will start low Lisinopril 2.5mg  daily. Decrease to eliminate caffeine intake Increase water intake Continue daily walking -Check blood pressure in 4 weeks.

## 2023-06-12 NOTE — Progress Notes (Signed)
Established Patient Office Visit  Introduced to nurse practitioner role and practice setting.  All questions answered.  Discussed provider/patient relationship and expectations.   Subjective   Patient ID: Curtis Combs, male    DOB: 27-Dec-1960  Age: 63 y.o. MRN: [id number]  Chief Complaint  Patient presents with   Hypertension    Follow up    The patient is a 63 year old male who presents for follow-up on blood pressure check.  He monitors his blood pressure at home daily, noting significant fluctuations. He describes his readings as 'like a yo-yo' and questions the accuracy of his home device. Recent readings show systolic values around 107 to 409 and diastolic values often in the 90s, with some readings in the 70s. Despite walking more and eating healthier meals prepared by his wife, his blood pressure remains higher than previous measurements.  He experiences lightheadedness when bending over and standing up repeatedly, attributing it to either his inner ear issues or blood pressure changes. Approximately three to four months ago, he had an episode of lightheadedness while picking up sticks outside, leading to a possible brief loss of consciousness. He recalls feeling like he was going to pass out and later found himself on the ground with a sore thumb and his dog waking him up, suggesting he may have fallen. No chest pain or tightness, but occasionally feels his heart racing, accompanied by a flushed feeling.  He occasionally experiences palpitations, feeling his heart racing and a flushed sensation. He drinks at least 64 ounces of water daily, aiming for 80 ounces, and carries a half-gallon jug to ensure hydration. He has a history of being on amlodipine, which was discontinued due to lightheadedness. He is not currently on any medications.  He drinks a large cup of coffee daily and has been increasing his physical activity by walking more.  Hypertension        06/12/2023     3:47 PM 05/12/2023    3:51 PM 08/03/2021    3:05 PM  Depression screen PHQ 2/9  Decreased Interest 0 0 0  Down, Depressed, Hopeless 0 0 0  PHQ - 2 Score 0 0 0  Altered sleeping 0    Tired, decreased energy 0    Change in appetite 0    Feeling bad or failure about yourself  0    Trouble concentrating 0    Moving slowly or fidgety/restless 0    Suicidal thoughts 0    PHQ-9 Score 0    Difficult doing work/chores Not difficult at all         05/12/2023    3:51 PM  GAD 7 : Generalized Anxiety Score  Nervous, Anxious, on Edge 0  Control/stop worrying 0  Worry too much - different things 0  Trouble relaxing 0  Restless 0  Easily annoyed or irritable 0  Afraid - awful might happen 0  Total GAD 7 Score 0  Anxiety Difficulty Not difficult at all   Review of Systems  All other systems reviewed and are negative.   Negative unless indicated in HPI   Objective:     BP (!) 124/97 (BP Location: Right Arm, Patient Position: Standing, Cuff Size: Normal)   Pulse 73   Resp 14   Ht 5\' 5"  (1.651 m)   Wt 186 lb 8 oz (84.6 kg)   SpO2 99%   BMI 31.04 kg/m    Physical Exam Constitutional:      General: He is not in acute  distress.    Appearance: Normal appearance. He is not ill-appearing, toxic-appearing or diaphoretic.  HENT:     Head: Normocephalic.     Nose: Nose normal.     Mouth/Throat:     Mouth: Mucous membranes are moist.  Eyes:     Extraocular Movements: Extraocular movements intact.     Conjunctiva/sclera: Conjunctivae normal.     Pupils: Pupils are equal, round, and reactive to light.  Neck:     Vascular: No carotid bruit.  Cardiovascular:     Rate and Rhythm: Normal rate and regular rhythm.     Pulses: Normal pulses.     Heart sounds: Normal heart sounds. No murmur heard.    No friction rub. No gallop.  Pulmonary:     Effort: Pulmonary effort is normal. No respiratory distress.     Breath sounds: Normal breath sounds. No stridor. No wheezing, rhonchi or rales.   Chest:     Chest wall: No tenderness.  Musculoskeletal:     Right lower leg: No edema.     Left lower leg: No edema.  Skin:    General: Skin is warm and dry.     Capillary Refill: Capillary refill takes less than 2 seconds.  Neurological:     General: No focal deficit present.     Mental Status: He is alert and oriented to person, place, and time. Mental status is at baseline.     Cranial Nerves: No cranial nerve deficit.     Sensory: No sensory deficit.     Motor: No weakness.     Coordination: Coordination normal.     Gait: Gait normal.     EKG: there are no previous tracings available for comparison, normal sinus rhythm, PAC's noted. HR 67.   No results found for any visits on 06/12/23.    The 10-year ASCVD risk score (Arnett DK, et al., 2019) is: 8.7%    Assessment & Plan:  Primary hypertension Assessment & Plan: Persistent elevation of diastolic blood pressure despite lifestyle modifications.  At home readings  SBP=100s-130s; DBP=80s-90s No current medications. -Given hx lightheadedness and syncopal episode 3 months ago will start low Lisinopril 2.5mg  daily. Decrease to eliminate caffeine intake Increase water intake Continue daily walking -Check blood pressure in 4 weeks.  Orders: -     Lisinopril; Take 0.5 tablets (2.5 mg total) by mouth daily.  Dispense: 90 tablet; Refill: 0  Positional lightheadedness Assessment & Plan: Intermittent lightheadedness with positional changes Resolves when sitting down Had syncopal episode 3 months ago - first discoursed to provider today Was previously worked up by [facility] two years ago, amlodipine was stopped per patient Otherwise w/u appears to have normal findings in 2023. EKG today showed NSR with x1 PAC - otherwise normal rhythm Decrease to eliminate caffeine intake Increase water intake Zio patch ordered for at home monitoring Referral to cards urgent placed  Orders: -     EKG 12-Lead -     LONG TERM MONITOR (3-14  DAYS); Future -     Ambulatory referral to Cardiology  Intermittent palpitations Assessment & Plan: Pt has concerns for intermittent palpitations with feeling flushed EKG today was NSR with x1 PAC, no previous EKG to compare- otherwise normal Decrease to eliminate caffeine intake Increase water intake Be aware of when and what triggers palpitations Ordered Zio patch for 14 day cardiac monitor Referral to cards  Orders: -     EKG 12-Lead -     LONG TERM MONITOR (3-14 DAYS); Future  Syncope, unspecified syncope type Assessment & Plan: Pt disclosed syncopal episode 3 months ago while outside pt felt lightheaded and next thing he knew he was on ground with dog waking him up Has not had episode Still He gets lightheaded when bending over repetitively EKG performed in clinic - NSR with x1 PAC - otherwise, no EKG to compare in chart, no known arrhythmias per pt  Orthostatic blood pressure in clinic negative Decrease to eliminate caffeine intake Increase water intake Zio patch ordered Referral to cards    Orders: -     EKG 12-Lead -     LONG TERM MONITOR (3-14 DAYS); Future -     Ambulatory referral to Cardiology    Return in about 4 weeks (around 07/10/2023) for BP check.   I, Sallee Provencal, FNP, have reviewed all documentation for this visit. The documentation on 06/12/23 for the exam, diagnosis, procedures, and orders are all accurate and complete.   Sallee Provencal, FNP

## 2023-06-12 NOTE — Assessment & Plan Note (Addendum)
Pt has concerns for intermittent palpitations with feeling flushed EKG today was NSR with x1 PAC, no previous EKG to compare- otherwise normal Decrease to eliminate caffeine intake Increase water intake Be aware of when and what triggers palpitations Ordered Zio patch for 14 day cardiac monitor Referral to cards

## 2023-06-12 NOTE — Assessment & Plan Note (Addendum)
Pt disclosed syncopal episode 3 months ago while outside pt felt lightheaded and next thing he knew he was on ground with dog waking him up Has not had episode Still He gets lightheaded when bending over repetitively EKG performed in clinic - NSR with x1 PAC - otherwise, no EKG to compare in chart, no known arrhythmias per pt  Orthostatic blood pressure in clinic negative Decrease to eliminate caffeine intake Increase water intake Zio patch ordered Referral to cards

## 2023-06-19 ENCOUNTER — Other Ambulatory Visit: Payer: Self-pay

## 2023-06-19 ENCOUNTER — Emergency Department: Payer: 59

## 2023-06-19 ENCOUNTER — Encounter: Payer: Self-pay | Admitting: Family Medicine

## 2023-06-19 ENCOUNTER — Encounter: Payer: Self-pay | Admitting: Cardiovascular Disease

## 2023-06-19 ENCOUNTER — Emergency Department
Admission: EM | Admit: 2023-06-19 | Discharge: 2023-06-19 | Disposition: A | Discharge status: Home / Self Care | Payer: 59 | Attending: Emergency Medicine | Admitting: Emergency Medicine

## 2023-06-19 ENCOUNTER — Ambulatory Visit: Payer: 59 | Attending: Cardiovascular Disease | Admitting: Cardiovascular Disease

## 2023-06-19 ENCOUNTER — Encounter: Payer: Self-pay | Admitting: Emergency Medicine

## 2023-06-19 VITALS — BP 118/78 | HR 74 | Ht 65.0 in | Wt 185.4 lb

## 2023-06-19 DIAGNOSIS — R002 Palpitations: Secondary | ICD-10-CM | POA: Diagnosis not present

## 2023-06-19 DIAGNOSIS — E782 Mixed hyperlipidemia: Secondary | ICD-10-CM | POA: Diagnosis not present

## 2023-06-19 DIAGNOSIS — R000 Tachycardia, unspecified: Secondary | ICD-10-CM | POA: Diagnosis present

## 2023-06-19 DIAGNOSIS — I10 Essential (primary) hypertension: Secondary | ICD-10-CM

## 2023-06-19 LAB — CBC
HCT: 50.2 % (ref 39.0–52.0)
Hemoglobin: 16.9 g/dL (ref 13.0–17.0)
MCH: 30 pg (ref 26.0–34.0)
MCHC: 33.7 g/dL (ref 30.0–36.0)
MCV: 89 fL (ref 80.0–100.0)
Platelets: 282 10*3/uL (ref 150–400)
RBC: 5.64 MIL/uL (ref 4.22–5.81)
RDW: 13.8 % (ref 11.5–15.5)
WBC: 9.7 10*3/uL (ref 4.0–10.5)
nRBC: 0 % (ref 0.0–0.2)

## 2023-06-19 LAB — BASIC METABOLIC PANEL
Anion gap: 14 (ref 5–15)
BUN: 15 mg/dL (ref 8–23)
CO2: 22 mmol/L (ref 22–32)
Calcium: 9 mg/dL (ref 8.9–10.3)
Chloride: 100 mmol/L (ref 98–111)
Creatinine, Ser: 1.34 mg/dL — ABNORMAL HIGH (ref 0.61–1.24)
GFR, Estimated: >60 mL/min (ref >60)
Glucose, Bld: 130 mg/dL — ABNORMAL HIGH (ref 70–99)
Potassium: 3.5 mmol/L (ref 3.5–5.1)
Sodium: 136 mmol/L (ref 135–145)

## 2023-06-19 LAB — D-DIMER, QUANTITATIVE: D-Dimer, Quant: 0.5 ug{FEU}/mL (ref 0.00–0.50)

## 2023-06-19 LAB — TROPONIN I (HIGH SENSITIVITY)
Troponin I (High Sensitivity): 6 ng/L (ref <18)
Troponin I (High Sensitivity): 6 ng/L (ref <18)

## 2023-06-19 LAB — TSH: TSH: 2.297 u[IU]/mL (ref 0.350–4.500)

## 2023-06-19 LAB — T4, FREE: Free T4: 0.62 ng/dL (ref 0.61–1.12)

## 2023-06-19 MED ORDER — LACTATED RINGERS IV BOLUS
1000.0000 mL | Freq: Once | INTRAVENOUS | Status: AC
  Administered 2023-06-19: 1000 mL via INTRAVENOUS

## 2023-06-19 NOTE — Progress Notes (Signed)
 06/19/2023 Curtis Combs [facility]   02-14-1961  [id number]  Primary Physician Tasia Farr, FNP Primary Cardiologist: Avanell Leigh MD Bennye Bravo, [place]  HPI:  Curtis Combs is a 63 y.o. mildly overweight married Caucasian male father of 2, grandfather 6 grandchildren who is accompanied by his wife Curtis Combs today.  He works as a Programmer, multimedia.  His risk factors include treated hypertension and untreated hyperlipidemia.  There is no family history of heart disease.  Never had a heart attack or stroke.  He denies chest pain or shortness of breath.  He has had episode of tachycardia palpitations beginning this fall but becoming more frequent.  This morning he woke up and had a heart rate of 200.  He went to the emergency room at which time the episode resolved spontaneously.  He also had an episode of syncope this fall for unclear reasons.   Current Meds  Medication Sig   lisinopril  (ZESTRIL ) 5 MG tablet Take 0.5 tablets (2.5 mg total) by mouth daily.     No Known Allergies  Social History   Socioeconomic History   Marital status: Married    Spouse name: Not on file   Number of children: Not on file   Years of education: Not on file   Highest education level: 12th grade  Occupational History   Not on file  Tobacco Use   Smoking status: Never   Smokeless tobacco: Never  Vaping Use   Vaping status: Never Used  Substance and Sexual Activity   Alcohol use: No   Drug use: No   Sexual activity: Yes    Birth control/protection: Surgical    Comment: Vasectomy  Other Topics Concern   Not on file  Social History Narrative   Not on file   Social Drivers of Health   Financial Resource Strain: Low Risk  (05/05/2023)   Overall Financial Resource Strain (CARDIA)    Difficulty of Paying Living Expenses: Not very hard  Food Insecurity: No Food Insecurity (05/05/2023)   Hunger Vital Sign    Worried About Running Out of Food in the Last Year: Never  true    Ran Out of Food in the Last Year: Never true  Transportation Needs: No Transportation Needs (05/05/2023)   PRAPARE - Administrator, Civil Service (Medical): No    Lack of Transportation (Non-Medical): No  Physical Activity: Insufficiently Active (05/05/2023)   Exercise Vital Sign    Days of Exercise per Week: 3 days    Minutes of Exercise per Session: 30 min  Stress: No Stress Concern Present (05/05/2023)   [employer] of Occupational Health - Occupational Stress Questionnaire    Feeling of Stress : Not at all  Social Connections: Socially Integrated (05/05/2023)   Social Connection and Isolation Panel [NHANES]    Frequency of Communication with Friends and Family: More than three times a week    Frequency of Social Gatherings with Friends and Family: More than three times a week    Attends Religious Services: More than 4 times per year    Active Member of [employer] or Organizations: Yes    Attends Engineer, structural: More than 4 times per year    Marital Status: Married  Catering manager Violence: Not on file     Review of Systems: General: negative for chills, fever, night sweats or weight changes.  Cardiovascular: negative for chest pain, dyspnea on exertion, edema, orthopnea, palpitations, paroxysmal nocturnal dyspnea or shortness  of breath Dermatological: negative for rash Respiratory: negative for cough or wheezing Urologic: negative for hematuria Abdominal: negative for nausea, vomiting, diarrhea, bright red blood per rectum, melena, or hematemesis Neurologic: negative for visual changes, syncope, or dizziness All other systems reviewed and are otherwise negative except as noted above.    Blood pressure 118/78, pulse 74, height 5\' 5"  (1.651 m), weight 185 lb 6.4 oz (84.1 kg), SpO2 96%.  General appearance: alert and no distress Neck: no adenopathy, no carotid bruit, no JVD, supple, symmetrical, trachea midline, and thyroid  not enlarged,  symmetric, no tenderness/mass/nodules Lungs: clear to auscultation bilaterally Heart: regular rate and rhythm, S1, S2 normal, no murmur, click, rub or gallop Extremities: extremities normal, atraumatic, no cyanosis or edema Pulses: 2+ and symmetric Skin: Skin color, texture, turgor normal. No rashes or lesions Neurologic: Grossly normal  EKG sinus rhythm PACs.      ASSESSMENT AND PLAN:   Hypertension History of essential hypertension with blood pressure measured today at 118/78.  He was recently started on lisinopril .  Mixed hyperlipidemia History of hyperlipidemia not on statin therapy with lipid profile performed 05/12/2023 revealing a total cholesterol 194, LDL 121 and HDL 62.  Intermittent palpitations [name] has had been having palpitations over the last several months.  They have become more frequent.  When he woke up early this morning his heart rate was 200.  He went to the emergency room by which time the episode had resolved spontaneously.  I suspect he is having episodes of SVT.  I have asked him to stop drinking coffee.  His primary has ordered a 2-week Zio patch.  I am also going to get a 2D echo and a coronary calcium  score.     Avanell Leigh MD FACP,FACC,FAHA, [facility] 06/19/2023 3:13 PM

## 2023-06-19 NOTE — ED Provider Notes (Signed)
 [facility] Provider Note    Event Date/Time   First MD Initiated Contact with Patient 06/19/23 [id number]     (approximate)   History   Tachycardia   HPI  Curtis Combs is a 63 y.o. male with hypertension, hyperlipidemia followed by [facility] cardiology who comes in with tachycardia.  Patient reports having increased heart rate up to 200 bpm he states that he has an appointment with cardiology this afternoon.  Patient reports that since the fall he has had intermittent episodes of palpitations.  He states that the episode today happened and lasted for little bit longer than typical.  He states his heart rate goes up to the 200s.  He states that it last about 10 to 15 minutes.  He was recently placed on some medication for his blood pressure called lisinopril .  Patient denies any risk factors for PE.  He does report recently having a GI bug but states that those symptoms have since all resolved.  He has a cardiology appointment later this afternoon.  He reported a little bit of chest pain with the palpitations but denies any currently.  Denies any shortness of breath.  States that he just feels more weak than anything. Physical Exam   Triage Vital Signs: ED Triage Vitals  Encounter Vitals Group     BP 06/19/23 0531 (!) 132/91     Systolic BP Percentile --      Diastolic BP Percentile --      Pulse Rate 06/19/23 0531 (!) 102     Resp --      Temp 06/19/23 0531 97.8 F (36.6 C)     Temp Source 06/19/23 0531 Oral     SpO2 06/19/23 0531 98 %     Weight 06/19/23 0532 186 lb (84.4 kg)     Height 06/19/23 0532 5\' 5"  (1.651 m)     Head Circumference --      Peak Flow --      Pain Score 06/19/23 0532 0     Pain Loc --      Pain Education --      Exclude from Growth Chart --     Most recent vital signs: Vitals:   06/19/23 0531  BP: (!) 132/91  Pulse: (!) 102  Temp: 97.8 F (36.6 C)  SpO2: 98%     General: Awake, no distress.  CV:  Good  peripheral perfusion.  Resp:  Normal effort.  Clear lungs Abd:  No distention.  Soft and nontender Other:  No swelling in legs.  No calf tenderness   ED Results / Procedures / Treatments   Labs (all labs ordered are listed, but only abnormal results are displayed) Labs Reviewed  BASIC METABOLIC PANEL - Abnormal; Notable for the following components:      Result Value   Glucose, Bld 130 (*)    Creatinine, Ser 1.34 (*)    All other components within normal limits  CBC  TROPONIN I (HIGH SENSITIVITY)     EKG  My interpretation of EKG:  Sinus rate of 99 without any ST elevation, T wave inversion in lead III and V6, occasional PVC, PAC  RADIOLOGY I have reviewed the xray personally and interpreted and no evidence of any pneumonia  PROCEDURES:  Critical Care performed: No  .1-3 Lead EKG Interpretation  Performed by: Lubertha Rush, MD Authorized by: Lubertha Rush, MD     Interpretation: normal     ECG rate:  70  ECG rate assessment: normal     Rhythm: sinus rhythm     Ectopy: none     Conduction: normal      MEDICATIONS ORDERED IN ED: Medications  lactated ringers  bolus 1,000 mL (1,000 mLs Intravenous New Bag/Given 06/19/23 0802)     IMPRESSION / MDM / ASSESSMENT AND PLAN / ED COURSE  I reviewed the triage vital signs and the nursing notes.   Patient's presentation is most consistent with acute presentation with potential threat to life or bodily function.   Patient comes in with palpitations elevated heart rate.  Suspect most likely an arrhythmia such as A-fib, SVT, atrial flutter.  Patient is cardiology appointment today therefore we will hold off on starting metoprolol  and we discussed that patient will need a Holter monitor to have this further evaluated.  We did look for other reversible causes such as hyperthyroidism, PE, electro abnormalities, AKI.  Initial troponin negative.  BMP shows slightly elevated creatinine.  CBC reassuring.  Patient's creatinine is  a little bit elevated I suspect the patient is a little bit dry from his episodes of GI bug a few days ago so patient was given a liter of fluid.  His D-dimer is negative and patient has low Wells score.  Repeat troponin negative thyroids are still pending but patient's heart rate is reassuring and would not need admission for thyroid  issues.  Patient is close follow-up with cardiology can follow-up with his thyroid  testing in MyChart discussed with his primary care doctor.  He is going to go to his cardiologist this afternoon discussed metoprolol  but at this time he feels comfortable with discharge home and is reassured that workup is otherwise reassuring  The patient is on the cardiac monitor to evaluate for evidence of arrhythmia and/or significant heart rate changes.      FINAL CLINICAL IMPRESSION(S) / ED DIAGNOSES   Final diagnoses:  Palpitations  Tachycardia     Rx / DC Orders   ED Discharge Orders     None        Note:  This document was prepared using Dragon voice recognition software and may include unintentional dictation errors.   Lubertha Rush, MD 06/19/23 [phone]

## 2023-06-19 NOTE — Patient Instructions (Signed)
 Medication Instructions:  Your physician recommends that you continue on your current medications as directed. Please refer to the Current Medication list given to you today.  *If you need a refill on your cardiac medications before your next appointment, please call your pharmacy*   Testing/Procedures: Your physician has requested that you have an echocardiogram. Echocardiography is a painless test that uses sound waves to create images of your heart. It provides your doctor with information about the size and shape of your heart and how well your heart's chambers and valves are working. This procedure takes approximately one hour. There are no restrictions for this procedure. Please do NOT wear cologne, perfume, aftershave, or lotions (deodorant is allowed). Please arrive 15 minutes prior to your appointment time.  Please note: We ask at that you not bring children with you during ultrasound (echo/ vascular) testing. Due to room size and safety concerns, children are not allowed in the ultrasound rooms during exams. Our front office staff cannot provide observation of children in our lobby area while testing is being conducted. An adult accompanying a patient to their appointment will only be allowed in the ultrasound room at the discretion of the ultrasound technician under special circumstances. We apologize for any inconvenience.   Dr. Katheryne Pane has ordered a CT coronary calcium  score.   Test locations:  [facility]  [facility]  This is $99 out of pocket.   Coronary CalciumScan A coronary calcium  scan is an imaging test used to look for deposits of calcium  and other fatty materials (plaques) in the inner lining of the blood vessels of the heart (coronary arteries). These deposits of calcium  and plaques can partly clog and narrow the coronary arteries without producing any symptoms or warning signs.  This puts a person at risk for a heart attack. This test can detect these deposits before symptoms develop. Tell a health care provider about: Any allergies you have. All medicines you are taking, including vitamins, herbs, eye drops, creams, and over-the-counter medicines. Any problems you or family members have had with anesthetic medicines. Any blood disorders you have. Any surgeries you have had. Any medical conditions you have. Whether you are pregnant or may be pregnant. What are the risks? Generally, this is a safe procedure. However, problems may occur, including: Harm to a pregnant woman and her unborn baby. This test involves the use of radiation. Radiation exposure can be dangerous to a pregnant woman and her unborn baby. If you are pregnant, you generally should not have this procedure done. Slight increase in the risk of cancer. This is because of the radiation involved in the test. What happens before the procedure? No preparation is needed for this procedure. What happens during the procedure? You will undress and remove any jewelry around your neck or chest. You will put on a hospital gown. Sticky electrodes will be placed on your chest. The electrodes will be connected to an electrocardiogram (ECG) machine to record a tracing of the electrical activity of your heart. A CT scanner will take pictures of your heart. During this time, you will be asked to lie still and hold your breath for 2-3 seconds while a picture of your heart is being taken. The procedure may vary among health care providers and hospitals. What happens after the procedure? You can get dressed. You can return to your normal activities. It is up to you to get the results of your test.  Ask your health care provider, or the department that is doing the test, when your results will be ready. Summary A coronary calcium  scan is an imaging test used to look for deposits of calcium  and other fatty materials  (plaques) in the inner lining of the blood vessels of the heart (coronary arteries). Generally, this is a safe procedure. Tell your health care provider if you are pregnant or may be pregnant. No preparation is needed for this procedure. A CT scanner will take pictures of your heart. You can return to your normal activities after the scan is done. This information is not intended to replace advice given to you by your health care provider. Make sure you discuss any questions you have with your health care provider. Document Released: 10/22/2007 Document Revised: 03/14/2016 Document Reviewed: 03/14/2016 Elsevier Interactive Patient Education  2017 [employer].   Follow-Up: At [facility], you and your health needs are our priority.  As part of our continuing mission to provide you with exceptional heart care, we have created designated Provider Care Teams.  These Care Teams include your primary Cardiologist (physician) and Advanced Practice Providers (APPs -  Physician Assistants and Nurse Practitioners) who all work together to provide you with the care you need, when you need it.  We recommend signing up for the patient portal called "MyChart".  Sign up information is provided on this After Visit Summary.  MyChart is used to connect with patients for Virtual Visits (Telemedicine).  Patients are able to view lab/test results, encounter notes, upcoming appointments, etc.  Non-urgent messages can be sent to your provider as well.   To learn more about what you can do with MyChart, go to [email or web address].    Your next appointment:   6 week(s)  Provider:   Lauro Portal, MD    Other Instructions

## 2023-06-19 NOTE — Assessment & Plan Note (Signed)
 History of essential hypertension with blood pressure measured today at 118/78.  He was recently started on lisinopril .

## 2023-06-19 NOTE — ED Triage Notes (Signed)
 Patient coming to ED for evaluation of increased heart rate.  Reports "I was getting ready for work this morning and my heart started racing.  It was beating about 200 beats per minute."  Reports symptoms lasted "a little longer than it normally does.  It normally last a few minutes."  Hx appointment with cardiology this afternoon. Recently started on blood pressure medication

## 2023-06-19 NOTE — Assessment & Plan Note (Signed)
 Curtis Combs has had been having palpitations over the last several months.  They have become more frequent.  When he woke up early this morning his heart rate was 200.  He went to the emergency room by which time the episode had resolved spontaneously.  I suspect he is having episodes of SVT.  I have asked him to stop drinking coffee.  His primary has ordered a 2-week Zio patch.  I am also going to get a 2D echo and a coronary calcium  score.

## 2023-06-19 NOTE — Assessment & Plan Note (Signed)
 History of hyperlipidemia not on statin therapy with lipid profile performed 05/12/2023 revealing a total cholesterol 194, LDL 121 and HDL 62.

## 2023-06-19 NOTE — ED Notes (Signed)
 Lab called to add on tropnin

## 2023-06-19 NOTE — Discharge Instructions (Signed)
 Discussed with your cardiologist whether or not they want to start you on a beta-blocker such as metoprolol  and to stop taking your lisinopril  given your blood pressures are on the lower end and return to the ER if you develop worsening symptoms and continued palpitations or any other concerns but today your workup was negative for heart attack, blood clots, severe electrolyte abnormalities.  Your thyroid  testing is pending and you can follow this up in MyChart

## 2023-06-23 ENCOUNTER — Ambulatory Visit
Admission: RE | Admit: 2023-06-23 | Discharge: 2023-06-23 | Disposition: A | Discharge status: Home / Self Care | Payer: Self-pay | Source: Ambulatory Visit | Attending: Cardiovascular Disease | Admitting: Cardiovascular Disease

## 2023-06-23 DIAGNOSIS — E782 Mixed hyperlipidemia: Secondary | ICD-10-CM | POA: Insufficient documentation

## 2023-06-23 DIAGNOSIS — R002 Palpitations: Secondary | ICD-10-CM | POA: Insufficient documentation

## 2023-06-23 DIAGNOSIS — I10 Essential (primary) hypertension: Secondary | ICD-10-CM | POA: Insufficient documentation

## 2023-06-26 ENCOUNTER — Telehealth: Payer: Self-pay | Admitting: Cardiovascular Disease

## 2023-06-26 ENCOUNTER — Emergency Department
Admission: EM | Admit: 2023-06-26 | Discharge: 2023-06-26 | Disposition: A | Discharge status: Home / Self Care | Payer: 59 | Attending: Emergency Medicine | Admitting: Emergency Medicine

## 2023-06-26 ENCOUNTER — Other Ambulatory Visit: Payer: Self-pay

## 2023-06-26 ENCOUNTER — Encounter: Payer: Self-pay | Admitting: Cardiovascular Disease

## 2023-06-26 DIAGNOSIS — I10 Essential (primary) hypertension: Secondary | ICD-10-CM | POA: Insufficient documentation

## 2023-06-26 DIAGNOSIS — I471 Supraventricular tachycardia, unspecified: Secondary | ICD-10-CM | POA: Diagnosis not present

## 2023-06-26 DIAGNOSIS — R000 Tachycardia, unspecified: Secondary | ICD-10-CM | POA: Diagnosis present

## 2023-06-26 DIAGNOSIS — E782 Mixed hyperlipidemia: Secondary | ICD-10-CM

## 2023-06-26 LAB — COMPREHENSIVE METABOLIC PANEL
ALT: 50 U/L — ABNORMAL HIGH (ref 0–44)
AST: 40 U/L (ref 15–41)
Albumin: 4.5 g/dL (ref 3.5–5.0)
Alkaline Phosphatase: 71 U/L (ref 38–126)
Anion gap: 15 (ref 5–15)
BUN: 16 mg/dL (ref 8–23)
CO2: 23 mmol/L (ref 22–32)
Calcium: 10.2 mg/dL (ref 8.9–10.3)
Chloride: 99 mmol/L (ref 98–111)
Creatinine, Ser: 1.27 mg/dL — ABNORMAL HIGH (ref 0.61–1.24)
GFR, Estimated: >60 mL/min (ref >60)
Glucose, Bld: 124 mg/dL — ABNORMAL HIGH (ref 70–99)
Potassium: 3.8 mmol/L (ref 3.5–5.1)
Sodium: 137 mmol/L (ref 135–145)
Total Bilirubin: 1.1 mg/dL (ref 0.0–1.2)
Total Protein: 7.9 g/dL (ref 6.5–8.1)

## 2023-06-26 LAB — T4, FREE: Free T4: 0.83 ng/dL (ref 0.61–1.12)

## 2023-06-26 LAB — CBC WITH DIFFERENTIAL/PLATELET
Abs Immature Granulocytes: 0.05 10*3/uL (ref 0.00–0.07)
Basophils Absolute: 0.1 10*3/uL (ref 0.0–0.1)
Basophils Relative: 1 %
Eosinophils Absolute: 0.3 10*3/uL (ref 0.0–0.5)
Eosinophils Relative: 2 %
HCT: 53.3 % — ABNORMAL HIGH (ref 39.0–52.0)
Hemoglobin: 17.7 g/dL — ABNORMAL HIGH (ref 13.0–17.0)
Immature Granulocytes: 0 %
Lymphocytes Relative: 49 %
Lymphs Abs: 5.7 10*3/uL — ABNORMAL HIGH (ref 0.7–4.0)
MCH: 29.5 pg (ref 26.0–34.0)
MCHC: 33.2 g/dL (ref 30.0–36.0)
MCV: 88.7 fL (ref 80.0–100.0)
Monocytes Absolute: 0.9 10*3/uL (ref 0.1–1.0)
Monocytes Relative: 8 %
Neutro Abs: 4.6 10*3/uL (ref 1.7–7.7)
Neutrophils Relative %: 40 %
Platelets: 357 10*3/uL (ref 150–400)
RBC: 6.01 MIL/uL — ABNORMAL HIGH (ref 4.22–5.81)
RDW: 13.3 % (ref 11.5–15.5)
Smear Review: NORMAL
WBC: 11.7 10*3/uL — ABNORMAL HIGH (ref 4.0–10.5)
nRBC: 0 % (ref 0.0–0.2)

## 2023-06-26 LAB — TSH: TSH: 3.571 u[IU]/mL (ref 0.350–4.500)

## 2023-06-26 LAB — TROPONIN I (HIGH SENSITIVITY)
Troponin I (High Sensitivity): 6 ng/L (ref <18)
Troponin I (High Sensitivity): 12 ng/L (ref <18)

## 2023-06-26 LAB — MAGNESIUM: Magnesium: 2.1 mg/dL (ref 1.7–2.4)

## 2023-06-26 MED ORDER — METOPROLOL SUCCINATE ER 25 MG PO TB24: 25.0000 mg | ORAL_TABLET | Freq: Every day | ORAL | 2 refills | Status: DC

## 2023-06-26 MED ORDER — SODIUM CHLORIDE 0.9 % IV BOLUS
1000.0000 mL | Freq: Once | INTRAVENOUS | Status: AC
  Administered 2023-06-26: 1000 mL via INTRAVENOUS

## 2023-06-26 MED ORDER — ADENOSINE 6 MG/2ML IV SOLN
INTRAVENOUS | Status: AC
Start: 2023-06-26 — End: 2023-06-26
  Administered 2023-06-26: 6 mg
  Filled 2023-06-26: qty 2

## 2023-06-26 MED ORDER — ADENOSINE 12 MG/4ML IV SOLN
INTRAVENOUS | Status: AC
  Filled 2023-06-26: qty 4

## 2023-06-26 MED ORDER — ATORVASTATIN CALCIUM 40 MG PO TABS: 40.0000 mg | ORAL_TABLET | Freq: Every day | ORAL | 3 refills | Status: DC

## 2023-06-26 NOTE — Telephone Encounter (Signed)
Patient's wife is requesting call back to her husband in regards to results. Please advise.

## 2023-06-26 NOTE — Telephone Encounter (Signed)
  Runell Gess, MD 06/24/2023  7:33 AM EST     CCS 614 with LDL 121, not on statin Rx. Start Atorva 40 mg and re check FLP 3 months. LDL goal <70   Patient called w/results  Rx(s) sent to pharmacy electronically. Lipid panel ordered/mailed

## 2023-06-26 NOTE — Discharge Instructions (Addendum)
You were seen in the emergency department today for your elevated heart rate which was consistent with supraventricular tachycardia.  I have sent a medication to your pharmacy which you should take once a day to help with symptoms.  Please follow-up with cardiology within the week for reassessment and ongoing management.  Please return for any severe worsening symptoms.

## 2023-06-26 NOTE — ED Notes (Signed)
Pt placed on monitor and hooked up. RN Shawna Orleans and RN Syndi at bedside. MD Wells assessing pt.

## 2023-06-26 NOTE — ED Triage Notes (Signed)
Pt comes with c/o tachycardia. Pt was seen here few days back for same. Pt had appt with cardiologist and had calcium score done. Pt started on Lipitor. Pt scheduled for echo.   Pt states today he noticed his heart rate speeding up. Pt check it at home and it showed 190s.  EKG shows 212 pt taken straight to room.

## 2023-06-26 NOTE — ED Provider Notes (Signed)
[facility] Provider Note    Event Date/Time   First MD Initiated Contact with Patient 06/26/23 1824     (approximate)   History   Tachycardia   HPI Curtis Combs is a 63 y.o. male with history of HTN, HLD presenting today for tachycardia.  Patient states he had onset of palpitations around dinnertime.  Some associated shortness of breath and lightheadedness associated with it.  Denies nausea, vomiting, chest pain, abdominal pain.  No recent illnesses.  Patient states he has had several episodes of palpitations recently.  He was seen in the emergency department 7 days ago for similar symptoms but by time of arrival heart rate was back to within normal limits.  Seen by cardiology as well after this with plan for Holter monitor but has not gotten it yet.  Reviewed ED notes and cardiology notes from 7 days ago.     Physical Exam   Triage Vital Signs: ED Triage Vitals [06/26/23 1829]  Encounter Vitals Group     BP 111/70     Systolic BP Percentile      Diastolic BP Percentile      Pulse Rate (!) 214     Resp 18     Temp 98 F (36.7 C)     Temp src      SpO2 96 %     Weight      Height      Head Circumference      Peak Flow      Pain Score 0     Pain Loc      Pain Education      Exclude from Growth Chart     Most recent vital signs: Vitals:   06/26/23 1840 06/26/23 1845  BP: 126/89 117/83  Pulse:    Resp: 17 19  Temp:    SpO2:     Physical Exam: I have reviewed the vital signs and nursing notes. General: Awake, alert, no acute distress.  Nontoxic appearing. Head:  Atraumatic, normocephalic.   ENT:  EOM intact, PERRL. Oral mucosa is pink and moist with no lesions. Neck: Neck is supple with full range of motion, No meningeal signs. Cardiovascular:  tachycardia, regular rhythm, No murmurs. Peripheral pulses palpable and equal bilaterally. Respiratory:  Symmetrical chest wall expansion.  No rhonchi, rales, or wheezes.  Good air  movement throughout.  No use of accessory muscles.   Musculoskeletal:  No cyanosis or edema. Moving extremities with full ROM Abdomen:  Soft, nontender, nondistended. Neuro:  GCS 15, moving all four extremities, interacting appropriately. Speech clear. Psych:  Calm, appropriate.   Skin:  Warm, dry, no rash.    ED Results / Procedures / Treatments   Labs (all labs ordered are listed, but only abnormal results are displayed) Labs Reviewed  CBC WITH DIFFERENTIAL/PLATELET - Abnormal; Notable for the following components:      Result Value   WBC 11.7 (*)    RBC 6.01 (*)    Hemoglobin 17.7 (*)    HCT 53.3 (*)    Lymphs Abs 5.7 (*)    All other components within normal limits  COMPREHENSIVE METABOLIC PANEL - Abnormal; Notable for the following components:   Glucose, Bld 124 (*)    Creatinine, Ser 1.27 (*)    ALT 50 (*)    All other components within normal limits  MAGNESIUM  TSH  T4, FREE  TROPONIN I (HIGH SENSITIVITY)  TROPONIN I (HIGH SENSITIVITY)     EKG My  EKG interpretation: Rate of 214, SVT.  No obvious ST elevations or depressions.  Repeat EKG afterwards with normal sinus rhythm and no obvious ST elevations or depressions   RADIOLOGY    PROCEDURES:  Critical Care performed: No  .Critical Care  Performed by: Janith Lima, MD Authorized by: Janith Lima, MD   Critical care provider statement:    Critical care time (minutes):  30   Critical care was necessary to treat or prevent imminent or life-threatening deterioration of the following conditions:  Cardiac failure   Critical care was time spent personally by me on the following activities:  Development of treatment plan with patient or surrogate, discussions with consultants, evaluation of patient's response to treatment, examination of patient, ordering and review of laboratory studies, ordering and review of radiographic studies, ordering and performing treatments and interventions, pulse oximetry,  re-evaluation of patient's condition and review of old charts    MEDICATIONS ORDERED IN ED: Medications  adenosine (ADENOCARD) 12 MG/4ML injection (  Not Given 06/26/23 1843)  adenosine (ADENOCARD) 6 MG/2ML injection (6 mg  Given 06/26/23 1834)  sodium chloride 0.9 % bolus 1,000 mL (0 mLs Intravenous Stopped 06/26/23 2137)     IMPRESSION / MDM / ASSESSMENT AND PLAN / ED COURSE  I reviewed the triage vital signs and the nursing notes.                              Differential diagnosis includes, but is not limited to, SVT, A-fib with RVR, atrial flutter, electrolyte abnormality  Patient's presentation is most consistent with acute presentation with potential threat to life or bodily function.  Patient is a 63 year old male presenting today for palpitations.  EKG on arrival showing SVT with heart rate in the 200s.  Blood pressure stable.  Mild shortness of breath but otherwise asymptomatic.  Patient given adenosine 6 mg with conversion to normal sinus rhythm and heart rates back in the 80s.  Blood pressure stable.  Given 1 L fluids.  Repeat EKG with no signs of ischemia.  Troponins negative x 2.  Thyroid panel negative.  CBC largely unremarkable except for some hemoconcentration.  CMP with slight AKI but improved from 1 week ago.  Magnesium normal.  Patient otherwise remained stable for several hours of observation in the ED.  Will discharge with daily metoprolol and have him follow-up with cardiology.  Given strict return precautions.  The patient is on the cardiac monitor to evaluate for evidence of arrhythmia and/or significant heart rate changes. Clinical Course as of 06/26/23 2223  Mon Jun 26, 2023  1610 Patient converted back to normal sinus rhythm with heart rates in the 80s following one 6 mg dose of adenosine [DW]  1940 Creatinine(!): 1.27 Creatinine improved today and given 1L fluid [DW]    Clinical Course User Index [DW] Janith Lima, MD     FINAL CLINICAL IMPRESSION(S) /  ED DIAGNOSES   Final diagnoses:  SVT (supraventricular tachycardia) (HCC)     Rx / DC Orders   ED Discharge Orders          Ordered    metoprolol succinate (TOPROL XL) 25 MG 24 hr tablet  Daily        06/26/23 2214             Note:  This document was prepared using Dragon voice recognition software and may include unintentional dictation errors.   Janith Lima, MD 06/26/23  2224  

## 2023-06-27 ENCOUNTER — Other Ambulatory Visit: Payer: Self-pay | Admitting: Cardiovascular Disease

## 2023-06-27 DIAGNOSIS — R002 Palpitations: Secondary | ICD-10-CM

## 2023-06-27 DIAGNOSIS — E782 Mixed hyperlipidemia: Secondary | ICD-10-CM

## 2023-06-27 DIAGNOSIS — I10 Essential (primary) hypertension: Secondary | ICD-10-CM

## 2023-06-28 ENCOUNTER — Telehealth: Payer: Self-pay | Admitting: Cardiovascular Disease

## 2023-06-28 NOTE — Telephone Encounter (Signed)
Pt's wife called and appt scheduled

## 2023-06-28 NOTE — Telephone Encounter (Signed)
Patient identification verified by 2 forms. Marilynn Rail, RN    Called and spoke to patients wife Bayard Males states:   -patient currently at work and does not have cell phone service   -would like to know if Dr. Allyson Sabal agrees with recent ED recommendations   -Patient was started on Metoprolol by ED provider  -ED advised patient to follow up with cardiology next week   -would like follow up visit at [facility] off  Informed Junious Dresser:   -Dr. Allyson Sabal has not responded to recent message   -best to follow ED recommendations for now, will be outreached when Dr. Allyson Sabal responds   -message sent to [facility] office for assistance with scheduling with APP next week  Junious Dresser agrees with plan, no questions at this time

## 2023-06-28 NOTE — Telephone Encounter (Signed)
   Pt's wife calling back to follow up pt's mychart message yesterday. She said, pt has not taken his meds yet and waiting for Dr. Hazle Coca advised . She requested to call her back since p is at work and they don't have very good signal there. Call her at [phone]

## 2023-06-30 ENCOUNTER — Telehealth: Payer: Self-pay | Admitting: *Deleted

## 2023-06-30 ENCOUNTER — Other Ambulatory Visit: Payer: Self-pay | Admitting: *Deleted

## 2023-06-30 DIAGNOSIS — I471 Supraventricular tachycardia, unspecified: Secondary | ICD-10-CM

## 2023-06-30 NOTE — Telephone Encounter (Signed)
Called patient and made him aware that Dr. Allyson Sabal has requested a referral to EP for PSVT. Made patient aware that someone will call to set up an appointment. Patient verbalized an understanding.

## 2023-07-03 ENCOUNTER — Ambulatory Visit: Payer: 59 | Admitting: Cardiology

## 2023-07-03 NOTE — Progress Notes (Deleted)
  Cardiology Office Note:  .   Date:  07/03/2023  ID:  Melanee Spry, DOB Mar 08, 1961, MRN [id number] PCP: Sallee Provencal, FNP  [facility] Providers Cardiologist:  None { Click to update primary MD,subspecialty MD or APP then REFRESH:1}   History of Present Illness: .   Yoshiaki Kreuser is a 63 y.o. male ***  ROS: ***  Studies Reviewed: .        *** Risk Assessment/Calculations:   {Does this patient have ATRIAL FIBRILLATION?:[phone]} No BP recorded.  {Refresh Note OR Click here to enter BP  :1}***       Physical Exam:   VS:  There were no vitals taken for this visit.   Wt Readings from Last 3 Encounters:  06/19/23 185 lb 6.4 oz (84.1 kg)  06/19/23 186 lb (84.4 kg)  06/12/23 186 lb 8 oz (84.6 kg)    GEN: Well nourished, well developed in no acute distress NECK: No JVD; No carotid bruits CARDIAC: ***RRR, no murmurs, rubs, gallops RESPIRATORY:  Clear to auscultation without rales, wheezing or rhonchi  ABDOMEN: Soft, non-tender, non-distended EXTREMITIES:  No edema; No deformity   ASSESSMENT AND PLAN: .   ***    {Are you ordering a CV Procedure (e.g. stress test, cath, DCCV, TEE, etc)?   Press F2        :[id number]}  Dispo: ***  Signed, Nicholis Stepanek, NP

## 2023-07-04 ENCOUNTER — Ambulatory Visit: Payer: 59 | Attending: Cardiology | Admitting: Cardiology

## 2023-07-04 ENCOUNTER — Other Ambulatory Visit: Payer: Self-pay

## 2023-07-04 ENCOUNTER — Encounter: Payer: Self-pay | Admitting: Cardiology

## 2023-07-04 ENCOUNTER — Encounter: Payer: Self-pay | Admitting: Family Medicine

## 2023-07-04 VITALS — BP 124/80 | HR 72 | Ht 65.0 in | Wt 186.0 lb

## 2023-07-04 DIAGNOSIS — Z87898 Personal history of other specified conditions: Secondary | ICD-10-CM

## 2023-07-04 DIAGNOSIS — E785 Hyperlipidemia, unspecified: Secondary | ICD-10-CM | POA: Diagnosis not present

## 2023-07-04 DIAGNOSIS — I10 Essential (primary) hypertension: Secondary | ICD-10-CM

## 2023-07-04 DIAGNOSIS — I471 Supraventricular tachycardia, unspecified: Secondary | ICD-10-CM

## 2023-07-04 NOTE — Patient Instructions (Signed)
 Medication Instructions:  Your physician recommends that you continue on your current medications as directed. Please refer to the Current Medication list given to you today.  *If you need a refill on your cardiac medications before your next appointment, please call your pharmacy*  Lab Work: TODAY: BMET and CBC   Testing/Procedures: Ablation Your physician has recommended that you have an ablation. Catheter ablation is a medical procedure used to treat some cardiac arrhythmias (irregular heartbeats). During catheter ablation, a long, thin, flexible tube is put into a blood vessel in your groin (upper thigh), or neck. This tube is called an ablation catheter. It is then guided to your heart through the blood vessel. Radio frequency waves destroy small areas of heart tissue where abnormal heartbeats may cause an arrhythmia to start. Please see the instruction sheet given to you today.  Follow-Up: At [facility], you and your health needs are our priority.  As part of our continuing mission to provide you with exceptional heart care, we have created designated Provider Care Teams.  These Care Teams include your primary Cardiologist (physician) and Advanced Practice Providers (APPs -  Physician Assistants and Nurse Practitioners) who all work together to provide you with the care you need, when you need it.  Your next appointment:   4 weeks after your ablation  Provider:   You may see Dr. Jimmey Ralph or one of the following Advanced Practice Providers on your designated Care Team:   Francis Dowse, [place] [address]" [place], [place] Sherie Don, NP Canary Brim, NP

## 2023-07-04 NOTE — Progress Notes (Unsigned)
 Electrophysiology Office Note:   Date:  07/05/2023  ID:  Curtis Combs, DOB [birth year], MRN [id number]  Primary Cardiologist: None Electrophysiologist: None      History of Present Illness:   Curtis Combs is a 63 y.o. male with h/o HTN, HLD and SVT who is being seen today for EP evaluation at the request of Dr. Nanetta Batty.  Discussed the use of AI scribe software for clinical note transcription with the patient, who gave verbal consent to proceed.  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 The frequency of these episodes has increased over time, occurring initially every month or so, then every few weeks, and now more frequently. The patient describes feeling lightheaded and a loss of energy before the onset of these episodes. During one such episode, the patient was working in the yard and developed palpitations then passed out. The patient's EKG from a recent ER visit shows Supraventricular Tachycardia (SVT) with a heart rate of 219 beats per minute. The patient has been started on metoprolol to manage these episodes. Since starting metoprolol he has had no further episodes. Otherwise doing well with no new or acute complaints.  Review of systems complete and found to be negative unless listed in HPI.   EP Information / Studies Reviewed:    EKG is not ordered today. EKG from 06/26/23 reviewed which showed sinus rhythm with PACs. No obvious pre-excitation.     EKG 06/26/23:    CT Calcium Score 06/23/23:  IMPRESSION AND RECOMMENDATION: 1. Coronary calcium score of 614. This was 91st percentile for age and sex matched control. 2. CAC >300 in LAD, LCx, RCA. CAC-DRS A3/N3. 3. Recommend aspirin and statin if no contraindication. 4. Recommend cardiology consultation. 5. Continue heart healthy lifestyle and risk factor modification.  Physical Exam:   VS:  BP 124/80 (BP Location:  Left Arm, Patient Position: Sitting, Cuff Size: Normal)   Pulse 72   Ht 5\' 5"  (1.651 m)   Wt 186 lb (84.4 kg)   SpO2 97%   BMI 30.95 kg/m    Wt Readings from Last 3 Encounters:  07/04/23 186 lb (84.4 kg)  06/19/23 185 lb 6.4 oz (84.1 kg)  06/19/23 186 lb (84.4 kg)     GEN: Well nourished, well developed in no acute distress NECK: No JVD CARDIAC: Normal rate, regular rhythm. RESPIRATORY:  Clear to auscultation without rales, wheezing or rhonchi  ABDOMEN: Soft, non-distended EXTREMITIES:  No edema; No deformity   ASSESSMENT AND PLAN:   Curtis Combs is a 63 y.o. male with h/o HTN, HLD and SVT who is being seen today for EP evaluation at the request of Dr. Nanetta Batty.  #Symptomatic SVT: Narrow complex, regular tachycardia with rate ~210bpm. EKG suspicious for AVNRT.  -Continue metoprolol. -Echocardiogram pending, scheduled 07/11/23. -Zio ordered by PCP. Given that we have documented sustained SVT on 12 lead ECG, I do not feel this is necessary -Risk, benefits, and alternatives to EP study and radiofrequency ablation for SVT were also discussed in detail today. These risks include but are not limited to complete heart block, stroke, bleeding, vascular damage, tamponade, perforation, and death. The patient understands these risk and wishes to proceed.  We will therefore proceed with catheter ablation at the next available time.  Carto and ICE are requested for the procedure.   #Syncope: Reports that he was working outside in yard when he developed palpitations similar  to his documented episodes of SVT then passed out. - Informed him that due to  law he should avoid driving for 6 months after the event. 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 - I suspect his SVT was multifactorial, with rapid SVT likely the driving factor. We will try to have definitive treatment for SVT with cathter ablation.  #Hypertension: -At goal today.  Recommend checking  blood pressures 1-2 times per week at home and recording the values.  Recommend bringing these recordings to the primary care physician.  #Hyperlipidemia: Continue atorvastatin.  Follow up with Dr. Jimmey Ralph 4 weeks after ablation.  Signed, Nobie Putnam, MD

## 2023-07-04 NOTE — H&P (View-Only) (Signed)
 Electrophysiology Office Note:   Date:  07/05/2023  ID:  Curtis Combs, DOB 1961/05/05, MRN [id number]  Primary Cardiologist: None Electrophysiologist: None      History of Present Illness:   Curtis Combs is a 63 y.o. male with h/o HTN, HLD and SVT who is being seen today for EP evaluation at the request of Dr. Nanetta Batty.  Discussed the use of AI scribe software for clinical note transcription with the patient, who gave verbal consent to proceed.  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 The frequency of these episodes has increased over time, occurring initially every month or so, then every few weeks, and now more frequently. The patient describes feeling lightheaded and a loss of energy before the onset of these episodes. During one such episode, the patient was working in the yard and developed palpitations then passed out. The patient's EKG from a recent ER visit shows Supraventricular Tachycardia (SVT) with a heart rate of 219 beats per minute. The patient has been started on metoprolol to manage these episodes. Since starting metoprolol he has had no further episodes. Otherwise doing well with no new or acute complaints.  Review of systems complete and found to be negative unless listed in HPI.   EP Information / Studies Reviewed:    EKG is not ordered today. EKG from 06/26/23 reviewed which showed sinus rhythm with PACs. No obvious pre-excitation.     EKG 06/26/23:    CT Calcium Score 06/23/23:  IMPRESSION AND RECOMMENDATION: 1. Coronary calcium score of 614. This was 91st percentile for age and sex matched control. 2. CAC >300 in LAD, LCx, RCA. CAC-DRS A3/N3. 3. Recommend aspirin and statin if no contraindication. 4. Recommend cardiology consultation. 5. Continue heart healthy lifestyle and risk factor modification.  Physical Exam:   VS:  BP 124/80 (BP Location:  Left Arm, Patient Position: Sitting, Cuff Size: Normal)   Pulse 72   Ht 5\' 5"  (1.651 m)   Wt 186 lb (84.4 kg)   SpO2 97%   BMI 30.95 kg/m    Wt Readings from Last 3 Encounters:  07/04/23 186 lb (84.4 kg)  06/19/23 185 lb 6.4 oz (84.1 kg)  06/19/23 186 lb (84.4 kg)     GEN: Well nourished, well developed in no acute distress NECK: No JVD CARDIAC: Normal rate, regular rhythm. RESPIRATORY:  Clear to auscultation without rales, wheezing or rhonchi  ABDOMEN: Soft, non-distended EXTREMITIES:  No edema; No deformity   ASSESSMENT AND PLAN:   Curtis Combs is a 63 y.o. male with h/o HTN, HLD and SVT who is being seen today for EP evaluation at the request of Dr. Nanetta Batty.  #Symptomatic SVT: Narrow complex, regular tachycardia with rate ~210bpm. EKG suspicious for AVNRT.  -Continue metoprolol. -Echocardiogram pending, scheduled 07/11/23. -Zio ordered by PCP. Given that we have documented sustained SVT on 12 lead ECG, I do not feel this is necessary -Risk, benefits, and alternatives to EP study and radiofrequency ablation for SVT were also discussed in detail today. These risks include but are not limited to complete heart block, stroke, bleeding, vascular damage, tamponade, perforation, and death. The patient understands these risk and wishes to proceed.  We will therefore proceed with catheter ablation at the next available time.  Carto and ICE are requested for the procedure.   #Syncope: Reports that he was working outside in yard when he developed palpitations similar  to his documented episodes of SVT then passed out. - Informed him that due to [place] law he should avoid driving for 6 months after the event. 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 - I suspect his SVT was multifactorial, with rapid SVT likely the driving factor. We will try to have definitive treatment for SVT with cathter ablation.  #Hypertension: -At goal today.  Recommend checking  blood pressures 1-2 times per week at home and recording the values.  Recommend bringing these recordings to the primary care physician.  #Hyperlipidemia: Continue atorvastatin.  Follow up with Dr. Jimmey Ralph 4 weeks after ablation.  Signed, Nobie Putnam, MD

## 2023-07-05 ENCOUNTER — Other Ambulatory Visit: Payer: Self-pay | Admitting: Family Medicine

## 2023-07-05 LAB — BASIC METABOLIC PANEL
BUN/Creatinine Ratio: 17 (ref 10–24)
BUN: 18 mg/dL (ref 8–27)
CO2: 25 mmol/L (ref 20–29)
Calcium: 9.9 mg/dL (ref 8.6–10.2)
Chloride: 103 mmol/L (ref 96–106)
Creatinine, Ser: 1.08 mg/dL (ref 0.76–1.27)
Glucose: 94 mg/dL (ref 70–99)
Potassium: 4.6 mmol/L (ref 3.5–5.2)
Sodium: 141 mmol/L (ref 134–144)
eGFR: 78 mL/min/{1.73_m2} (ref >59)

## 2023-07-05 LAB — CBC
Hematocrit: 49 % (ref 37.5–51.0)
Hemoglobin: 16.1 g/dL (ref 13.0–17.7)
MCH: 29.8 pg (ref 26.6–33.0)
MCHC: 32.9 g/dL (ref 31.5–35.7)
MCV: 91 fL (ref 79–97)
Platelets: 311 10*3/uL (ref 150–450)
RBC: 5.41 x10E6/uL (ref 4.14–5.80)
RDW: 12.2 % (ref 11.6–15.4)
WBC: 10.2 10*3/uL (ref 3.4–10.8)

## 2023-07-06 ENCOUNTER — Telehealth (HOSPITAL_COMMUNITY): Payer: Self-pay

## 2023-07-06 ENCOUNTER — Other Ambulatory Visit: Payer: Self-pay | Admitting: Cardiovascular Disease

## 2023-07-06 DIAGNOSIS — R002 Palpitations: Secondary | ICD-10-CM

## 2023-07-06 DIAGNOSIS — I10 Essential (primary) hypertension: Secondary | ICD-10-CM

## 2023-07-06 DIAGNOSIS — E782 Mixed hyperlipidemia: Secondary | ICD-10-CM

## 2023-07-06 DIAGNOSIS — R000 Tachycardia, unspecified: Secondary | ICD-10-CM

## 2023-07-06 NOTE — Telephone Encounter (Signed)
 Attempted to reach patient to discuss upcoming procedure, no answer. Left VM for patient to return call.

## 2023-07-07 NOTE — Telephone Encounter (Signed)
 Spoke with patient to complete one month pre-procedure call.     New medical conditions?  No Recent hospitalizations or surgeries? No Started any new medications? No Patient made aware to contact office to inform of any new medications started. Any changes in activities of daily living? No  Pre-procedure testing scheduled: lab work 07/04/23.  Confirmed patient is scheduled for SVT Ablation on Wednesday, March 26 with Dr. Michele Rockers. Instructed patient to arrive at the Main Entrance A at [facility]: [address] [place], [place] [address] and check in at Admitting at 11:30 AM  Advised of plan to go home the same day and will only stay overnight if medically necessary. You MUST have a responsible adult to drive you home and MUST be with you the first 24 hours after you arrive home or your procedure could be cancelled.  Patient verbalized understanding to information provided and is agreeable to proceed with procedure.

## 2023-07-10 ENCOUNTER — Ambulatory Visit: Payer: 59 | Admitting: Family Medicine

## 2023-07-10 ENCOUNTER — Encounter: Payer: Self-pay | Admitting: Family Medicine

## 2023-07-10 VITALS — BP 119/87 | HR 71 | Ht 65.0 in | Wt 187.2 lb

## 2023-07-10 DIAGNOSIS — I10 Essential (primary) hypertension: Secondary | ICD-10-CM

## 2023-07-10 DIAGNOSIS — R002 Palpitations: Secondary | ICD-10-CM | POA: Diagnosis not present

## 2023-07-10 DIAGNOSIS — E782 Mixed hyperlipidemia: Secondary | ICD-10-CM | POA: Diagnosis not present

## 2023-07-10 NOTE — Assessment & Plan Note (Signed)
 Controlled, stable Continue daily Lisinopril 2.5mg   Continue at home monitoring x2 weekly GOAL<130/80 Home readings = 100s-110s/60s-70s Discussed may need to f/u post SVT ablation per cardiology If not okay for f/u in 6 months.

## 2023-07-10 NOTE — Assessment & Plan Note (Addendum)
 Started on Atorvastatin 40mg  daily by cardiology Continue statin

## 2023-07-10 NOTE — Assessment & Plan Note (Addendum)
 SVT episodes confirmed on EKG, controlled with metoprolol 25mg  daily per EP cards.  Palpitations controlled since starting metoprolol - denies episodes Zio patch was dc'd given EKG capture Ablation scheduled to terminate arrhythmia on 08/02/23. - Monitor blood pressure post-ablation to assess antihypertensive therapy.

## 2023-07-10 NOTE — Progress Notes (Signed)
 Established Patient Office Visit  Introduced to nurse practitioner role and practice setting.  All questions answered.  Discussed provider/patient relationship and expectations.  Subjective   Patient ID: Curtis Combs, male    DOB: [birth year]  Age: 63 y.o. MRN: [id number]  Chief Complaint  Patient presents with   Follow-up    4 wk follow up BP check    Curtis Combs "Curtis Combs" is a 63 year old male with hypertension and arrhythmia who presents for follow-up after recent episodes of heart racing.  He has not experienced any episodes of heart racing since starting metoprolol 25 mg daily. He visited the emergency department twice due to these episodes as well as EP cards, where the arrhythmia, SVT, was captured on an EKG, negating the need for a Zio patch. Plans for ablation at end of month.  He is currently taking metoprolol 25 mg daily, lisinopril 2.5mg , and atorvastatin for cholesterol management. His blood pressure has been more controlled recently, although the diastolic pressure was slightly elevated today during visit, but home readings = 100s/60s-70s.        07/10/2023    3:48 PM 06/12/2023    3:47 PM 05/12/2023    3:51 PM  Depression screen PHQ 2/9  Decreased Interest 0 0 0  Down, Depressed, Hopeless 0 0 0  PHQ - 2 Score 0 0 0  Altered sleeping 0 0   Tired, decreased energy 0 0   Change in appetite 0 0   Feeling bad or failure about yourself  0 0   Trouble concentrating 0 0   Moving slowly or fidgety/restless 0 0   Suicidal thoughts 0 0   PHQ-9 Score 0 0   Difficult doing work/chores  Not difficult at all        07/10/2023    3:48 PM 05/12/2023    3:51 PM  GAD 7 : Generalized Anxiety Score  Nervous, Anxious, on Edge 0 0  Control/stop worrying 0 0  Worry too much - different things 0 0  Trouble relaxing 0 0  Restless 0 0  Easily annoyed or irritable 0 0  Afraid - awful might happen 0 0  Total GAD 7 Score 0 0  Anxiety Difficulty  Not difficult at all    Review of Systems  All other systems reviewed and are negative.   Negative unless indicated in HPI   Objective:     BP 119/87   Pulse 71   Ht 5\' 5"  (1.651 m)   Wt 187 lb 3.2 oz (84.9 kg)   SpO2 100%   BMI 31.15 kg/m    Physical Exam Constitutional:      General: He is not in acute distress.    Appearance: Normal appearance. He is not ill-appearing, toxic-appearing or diaphoretic.  HENT:     Head: Normocephalic.     Nose: Nose normal.     Mouth/Throat:     Mouth: Mucous membranes are moist.  Eyes:     Extraocular Movements: Extraocular movements intact.     Conjunctiva/sclera: Conjunctivae normal.     Pupils: Pupils are equal, round, and reactive to light.  Neck:     Vascular: No carotid bruit.  Cardiovascular:     Rate and Rhythm: Normal rate and regular rhythm.     Pulses: Normal pulses.     Heart sounds: Normal heart sounds. No murmur heard.    No friction rub. No gallop.  Pulmonary:     Effort: Pulmonary effort is normal. No  respiratory distress.     Breath sounds: Normal breath sounds. No stridor. No wheezing, rhonchi or rales.  Chest:     Chest wall: No tenderness.  Musculoskeletal:     Right lower leg: No edema.     Left lower leg: No edema.  Lymphadenopathy:     Cervical: No cervical adenopathy.  Skin:    General: Skin is warm and dry.     Capillary Refill: Capillary refill takes less than 2 seconds.  Neurological:     General: No focal deficit present.     Mental Status: He is alert and oriented to person, place, and time. Mental status is at baseline.     Cranial Nerves: No cranial nerve deficit.     Sensory: No sensory deficit.     Motor: No weakness.     Coordination: Coordination normal.     Gait: Gait normal.  Psychiatric:        Mood and Affect: Mood normal.        Behavior: Behavior normal.        Thought Content: Thought content normal.        Judgment: Judgment normal.      No results found for any visits on 07/10/23.    The  10-year ASCVD risk score (Arnett DK, et al., 2019) is: 8.8%    Assessment & Plan:  Intermittent palpitations Assessment & Plan: SVT episodes confirmed on EKG, controlled with metoprolol 25mg  daily per EP cards.  Palpitations controlled since starting metoprolol - denies episodes Zio patch was dc'd given EKG capture Ablation scheduled to terminate arrhythmia on 08/02/23. - Monitor blood pressure post-ablation to assess antihypertensive therapy.   Primary hypertension Assessment & Plan: Controlled, stable Continue daily Lisinopril 2.5mg   Continue at home monitoring x2 weekly GOAL<130/80 Home readings = 100s-110s/60s-70s Discussed may need to f/u post SVT ablation per cardiology If not okay for f/u in 6 months.    Mixed hyperlipidemia Assessment & Plan: Started on Atorvastatin 40mg  daily by cardiology Continue statin      Return in about 6 months (around 01/10/2024) for chronic disease mgmt - HTN.   I, Sallee Provencal, FNP, have reviewed all documentation for this visit. The documentation on 07/10/23 for the exam, diagnosis, procedures, and orders are all accurate and complete.   Sallee Provencal, FNP

## 2023-07-11 ENCOUNTER — Ambulatory Visit: Payer: 59 | Attending: Cardiovascular Disease

## 2023-07-11 DIAGNOSIS — R000 Tachycardia, unspecified: Secondary | ICD-10-CM

## 2023-07-11 LAB — ECHOCARDIOGRAM COMPLETE
AR max vel: 3.86 cm2
AV Area VTI: 3.49 cm2
AV Area mean vel: 3.77 cm2
AV Mean grad: 2.7 mmHg
AV Peak grad: 5 mmHg
Ao pk vel: 1.12 m/s
Area-P 1/2: 3.19 cm2
Calc EF: 56.2 %
S' Lateral: 3.1 cm
Single Plane A2C EF: 59.6 %
Single Plane A4C EF: 54.6 %

## 2023-07-11 NOTE — Telephone Encounter (Signed)
 Spoke to patient 3/24 office visit with Dr.Berry cancelled.He will keep appointment with Dr.Parker 4/15 and he will ask Dr.Parker if he needs to keep appointment with [facility] 5/7.

## 2023-07-26 ENCOUNTER — Telehealth (HOSPITAL_COMMUNITY): Payer: Self-pay

## 2023-07-26 NOTE — Telephone Encounter (Signed)
 Call placed to patient to discuss upcoming procedure.   Labs: completed.   Any recent signs of acute illness or been started on antibiotics? Pt had a tooth extraction on 07/20/23 to prepare for a bone graft/implant in four months. He denies having an abscess or s/o infection. He was placed on a 7 days course of antibiotics, scheduled to be complete by 07/28/23.  Will make Dr Jimmey Ralph aware.  Any medications to hold?  Metoprolol hold 3 days  Medication instructions:  On the morning of your procedure DO NOT take any medication.or the procedure may be rescheduled. Nothing to eat or drink after midnight prior to your procedure.  Confirmed patient is scheduled for SVT Ablation on Wednesday, March 26 with Dr. Michele Rockers. Instructed patient to arrive at the Main Entrance A at [facility]: [address] [place], [place] [address] and check in at Admitting at 9:00 AM  Advised of plan to go home the same day and will only stay overnight if medically necessary. You MUST have a responsible adult to drive you home and MUST be with you the first 24 hours after you arrive home or your procedure could be cancelled.  Patient verbalized understanding to all instructions provided and agreed to proceed with procedure.

## 2023-07-31 ENCOUNTER — Ambulatory Visit: Payer: 59 | Admitting: Cardiovascular Disease

## 2023-08-01 NOTE — Pre-Procedure Instructions (Signed)
Instructed patient on the following items: Arrival time 0800 Nothing to eat or drink after midnight No meds AM of procedure Responsible person to drive you home and stay with you for 24 hrs

## 2023-08-02 ENCOUNTER — Ambulatory Visit (HOSPITAL_COMMUNITY): Admitting: Certified Registered"

## 2023-08-02 ENCOUNTER — Ambulatory Visit (HOSPITAL_COMMUNITY)
Admission: RE | Admit: 2023-08-02 | Discharge: 2023-08-02 | Disposition: A | Discharge status: Home / Self Care | Payer: 59 | Source: Ambulatory Visit | Attending: Cardiology | Admitting: Cardiology

## 2023-08-02 ENCOUNTER — Encounter (HOSPITAL_COMMUNITY)
Admission: RE | Disposition: A | Discharge status: Home / Self Care | Payer: Self-pay | Source: Ambulatory Visit | Attending: Cardiology

## 2023-08-02 DIAGNOSIS — I10 Essential (primary) hypertension: Secondary | ICD-10-CM | POA: Diagnosis not present

## 2023-08-02 DIAGNOSIS — I4719 Other supraventricular tachycardia: Secondary | ICD-10-CM | POA: Diagnosis present

## 2023-08-02 DIAGNOSIS — E785 Hyperlipidemia, unspecified: Secondary | ICD-10-CM | POA: Insufficient documentation

## 2023-08-02 DIAGNOSIS — I471 Supraventricular tachycardia, unspecified: Secondary | ICD-10-CM | POA: Diagnosis not present

## 2023-08-02 DIAGNOSIS — R55 Syncope and collapse: Secondary | ICD-10-CM | POA: Diagnosis not present

## 2023-08-02 HISTORY — PX: SVT ABLATION: EP1225

## 2023-08-02 SURGERY — SVT ABLATION
Anesthesia: Monitor Anesthesia Care

## 2023-08-02 MED ORDER — HEPARIN SODIUM (PORCINE) 1000 UNIT/ML IJ SOLN
INTRAMUSCULAR | Status: AC
  Filled 2023-08-02: qty 10

## 2023-08-02 MED ORDER — ISOPROTERENOL HCL 0.2 MG/ML IJ SOLN
INTRAVENOUS | Status: DC | PRN
  Administered 2023-08-02: 2 ug/min via INTRAVENOUS

## 2023-08-02 MED ORDER — PHENYLEPHRINE HCL-NACL 20-0.9 MG/250ML-% IV SOLN
INTRAVENOUS | Status: DC | PRN
  Administered 2023-08-02: 20 ug/min via INTRAVENOUS

## 2023-08-02 MED ORDER — ONDANSETRON HCL 4 MG/2ML IJ SOLN
4.0000 mg | Freq: Four times a day (QID) | INTRAMUSCULAR | Status: DC | PRN
Start: 2023-08-02 — End: 2023-08-02

## 2023-08-02 MED ORDER — PROPOFOL 500 MG/50ML IV EMUL
INTRAVENOUS | Status: DC | PRN
  Administered 2023-08-02: 100 ug/kg/min via INTRAVENOUS

## 2023-08-02 MED ORDER — SODIUM CHLORIDE 0.9% FLUSH: 3.0000 mL | Freq: Two times a day (BID) | INTRAVENOUS | Status: DC

## 2023-08-02 MED ORDER — SODIUM CHLORIDE 0.9 % IV SOLN: 250.0000 mL | INTRAVENOUS | Status: DC | PRN

## 2023-08-02 MED ORDER — SODIUM CHLORIDE 0.9% FLUSH: 3.0000 mL | INTRAVENOUS | Status: DC | PRN

## 2023-08-02 MED ORDER — HEPARIN SODIUM (PORCINE) 1000 UNIT/ML IJ SOLN
INTRAMUSCULAR | Status: DC | PRN
  Administered 2023-08-02: 1000 [IU] via INTRAVENOUS

## 2023-08-02 MED ORDER — BUPIVACAINE HCL (PF) 0.25 % IJ SOLN
INTRAMUSCULAR | Status: AC
  Filled 2023-08-02: qty 30

## 2023-08-02 MED ORDER — HEPARIN (PORCINE) IN NACL 1000-0.9 UT/500ML-% IV SOLN
INTRAVENOUS | Status: DC | PRN
  Administered 2023-08-02: 500 mL

## 2023-08-02 MED ORDER — PHENYLEPHRINE 80 MCG/ML (10ML) SYRINGE FOR IV PUSH (FOR BLOOD PRESSURE SUPPORT)
PREFILLED_SYRINGE | INTRAVENOUS | Status: DC | PRN
Start: 2023-08-02 — End: 2023-08-02
  Administered 2023-08-02: 120 ug via INTRAVENOUS
  Administered 2023-08-02: 80 ug via INTRAVENOUS

## 2023-08-02 MED ORDER — PROPOFOL 10 MG/ML IV BOLUS
INTRAVENOUS | Status: DC | PRN
  Administered 2023-08-02: 30 mg via INTRAVENOUS
  Administered 2023-08-02: 10 mg via INTRAVENOUS
  Administered 2023-08-02: 20 mg via INTRAVENOUS
  Administered 2023-08-02: 10 mg via INTRAVENOUS
  Administered 2023-08-02: 30 mg via INTRAVENOUS
  Administered 2023-08-02: 20 mg via INTRAVENOUS

## 2023-08-02 MED ORDER — ACETAMINOPHEN 325 MG PO TABS: 650.0000 mg | ORAL_TABLET | ORAL | Status: DC | PRN

## 2023-08-02 MED ORDER — SODIUM CHLORIDE 0.9 % IV SOLN: INTRAVENOUS | Status: DC

## 2023-08-02 MED ORDER — ISOPROTERENOL HCL 0.2 MG/ML IJ SOLN
INTRAMUSCULAR | Status: AC
  Filled 2023-08-02: qty 5

## 2023-08-02 MED ORDER — BUPIVACAINE HCL (PF) 0.25 % IJ SOLN
INTRAMUSCULAR | Status: DC | PRN
  Administered 2023-08-02: 20 mL

## 2023-08-02 SURGICAL SUPPLY — 15 items
BAG SNAP BAND KOVER 36X36 (MISCELLANEOUS) IMPLANT
CATH BI DIR 7FR CS F-J 12 PIN (CATHETERS) IMPLANT
CATH CRD2 QUAD 6FR REP (CATHETERS) IMPLANT
CATH JOSEPH QUAD ALLRED 6F REP (CATHETERS) IMPLANT
CATH SMTCH THERMOCOOL SF DF (CATHETERS) IMPLANT
CLOSURE PERCLOSE PROSTYLE (VASCULAR PRODUCTS) IMPLANT
DEVICE CLOSURE MYNXGRIP 6/7F (Vascular Products) IMPLANT
PACK EP LF (CUSTOM PROCEDURE TRAY) ×1 IMPLANT
PAD DEFIB RADIO PHYSIO CONN (PAD) ×1 IMPLANT
PATCH CARTO3 (PAD) IMPLANT
SHEATH CARTO VIZIGO MED CURVE (SHEATH) IMPLANT
SHEATH PINNACLE 7F 10CM (SHEATH) IMPLANT
SHEATH PINNACLE 8F 10CM (SHEATH) IMPLANT
SHEATH PROBE COVER 6X72 (BAG) IMPLANT
TUBING SMART ABLATE COOLFLOW (TUBING) IMPLANT

## 2023-08-02 NOTE — Progress Notes (Signed)
 Patient walked to the bathroom without difficulties. Right groin level 0, clean, dry, and intact.

## 2023-08-02 NOTE — Anesthesia Preprocedure Evaluation (Signed)
 Anesthesia Evaluation  Patient identified by MRN, date of birth, ID band Patient awake    Reviewed: Allergy & Precautions, NPO status , Patient's Chart, lab work & pertinent test results  History of Anesthesia Complications Negative for: history of anesthetic complications  Airway Mallampati: II  TM Distance: >3 FB Neck ROM: Full    Dental  (+) Teeth Intact, Dental Advisory Given   Pulmonary neg shortness of breath, neg sleep apnea, neg COPD, neg recent URI   breath sounds clear to auscultation       Cardiovascular hypertension, Pt. on medications and Pt. on home beta blockers (-) angina + dysrhythmias Supra Ventricular Tachycardia  Rhythm:Regular     Neuro/Psych negative neurological ROS     GI/Hepatic negative GI ROS, Neg liver ROS,,,  Endo/Other  negative endocrine ROS    Renal/GU negative Renal ROS     Musculoskeletal negative musculoskeletal ROS (+)    Abdominal   Peds  Hematology negative hematology ROS (+) Lab Results      Component                Value               Date                      WBC                      10.2                07/04/2023                HGB                      16.1                07/04/2023                HCT                      49.0                07/04/2023                MCV                      91                  07/04/2023                PLT                      311                 07/04/2023              Anesthesia Other Findings   Reproductive/Obstetrics                             Anesthesia Physical Anesthesia Plan  ASA: 2  Anesthesia Plan: MAC   Post-op Pain Management: Minimal or no pain anticipated   Induction: Intravenous  PONV Risk Score and Plan: 1 and Propofol infusion and Treatment may vary due to age or medical condition  Airway Management Planned: Nasal Cannula, Natural Airway and Simple Face Mask  Additional Equipment:  None  Intra-op Plan:  Post-operative Plan:   Informed Consent: I have reviewed the patients History and Physical, chart, labs and discussed the procedure including the risks, benefits and alternatives for the proposed anesthesia with the patient or authorized representative who has indicated his/her understanding and acceptance.     Dental advisory given  Plan Discussed with: CRNA  Anesthesia Plan Comments:        Anesthesia Quick Evaluation

## 2023-08-02 NOTE — Transfer of Care (Signed)
 Immediate Anesthesia Transfer of Care Note  Patient: Curtis Combs  Procedure(s) Performed: SVT ABLATION  Patient Location: PACU and Cath Lab  Anesthesia Type:MAC  Level of Consciousness: awake, alert , and oriented  Airway & Oxygen Therapy: Patient Spontanous Breathing  Post-op Assessment: Report given to RN and Post -op Vital signs reviewed and stable  Post vital signs: Reviewed and stable  Last Vitals:  Vitals Value Taken Time  BP    Temp    Pulse    Resp    SpO2      Last Pain:  Vitals:   08/02/23 0822  TempSrc: Oral  PainSc:          Complications: There were no known notable events for this encounter.

## 2023-08-02 NOTE — Interval H&P Note (Signed)
 History and Physical Interval Note:  08/02/2023 10:53 AM  Curtis Combs  has presented today for surgery, with the diagnosis of symptomatic, sustained supraventricular tachycardia.  The various methods of treatment have been discussed with the patient and family. After consideration of risks, benefits and other options for treatment, the patient has consented to  Procedure(s): SVT ABLATION (N/A) as a surgical intervention.  The patient's history has been reviewed, patient examined, no change in status, stable for surgery.  I have reviewed the patient's chart and labs.  Questions were answered to the patient's satisfaction.     Nobie Putnam

## 2023-08-02 NOTE — Discharge Instructions (Signed)

## 2023-08-03 ENCOUNTER — Telehealth (HOSPITAL_COMMUNITY): Payer: Self-pay

## 2023-08-03 ENCOUNTER — Encounter (HOSPITAL_COMMUNITY): Payer: Self-pay | Admitting: Cardiology

## 2023-08-03 NOTE — Anesthesia Postprocedure Evaluation (Signed)
 Anesthesia Post Note  Patient: Curtis Combs  Procedure(s) Performed: SVT ABLATION     Patient location during evaluation: Cath Lab Anesthesia Type: MAC Level of consciousness: awake and alert Pain management: pain level controlled Vital Signs Assessment: post-procedure vital signs reviewed and stable Respiratory status: spontaneous breathing, nonlabored ventilation and respiratory function stable Cardiovascular status: stable and blood pressure returned to baseline Postop Assessment: no apparent nausea or vomiting Anesthetic complications: no   There were no known notable events for this encounter.                 Marranda Arakelian

## 2023-08-03 NOTE — Telephone Encounter (Signed)
 Spoke with patient to complete post procedure follow up call.  Patient reports no complications with groin site.   Instructions reviewed with patient:  Remove large bandage at puncture site after 24 hours. It is normal to have bruising, tenderness and a pea or marble sized lump/knot at the groin site which can take up to three months to resolve.  Get help right away if you notice sudden swelling at the puncture site.  Check your puncture site every day for signs of infection: fever, redness, swelling, pus drainage, warmth, foul odor or excessive pain. If this occurs, please call the office at [phone], to speak with the nurse. Get help right away if your puncture site is bleeding and the bleeding does not stop after applying firm pressure to the area.  You may continue to have skipped beats during the first several months after your procedure.  You will follow up with Dr.Josh Jimmey Ralph on 09/05/23.   Patient verbalized understanding to all instructions provided.

## 2023-08-08 ENCOUNTER — Encounter: Payer: Self-pay | Admitting: Cardiology

## 2023-08-22 ENCOUNTER — Ambulatory Visit: Payer: 59 | Admitting: Cardiology

## 2023-08-29 ENCOUNTER — Ambulatory Visit: Payer: 59 | Admitting: Cardiology

## 2023-09-05 ENCOUNTER — Ambulatory Visit: Admitting: Cardiology

## 2023-09-07 ENCOUNTER — Ambulatory Visit: Payer: 59 | Admitting: Cardiovascular Disease

## 2023-09-09 ENCOUNTER — Encounter: Payer: Self-pay | Admitting: Cardiology

## 2023-09-11 ENCOUNTER — Ambulatory Visit: Admitting: Dermatology

## 2023-09-11 ENCOUNTER — Other Ambulatory Visit: Payer: Self-pay

## 2023-09-11 ENCOUNTER — Encounter: Payer: Self-pay | Admitting: Dermatology

## 2023-09-11 DIAGNOSIS — D229 Melanocytic nevi, unspecified: Secondary | ICD-10-CM

## 2023-09-11 DIAGNOSIS — L820 Inflamed seborrheic keratosis: Secondary | ICD-10-CM | POA: Diagnosis not present

## 2023-09-11 DIAGNOSIS — L814 Other melanin hyperpigmentation: Secondary | ICD-10-CM

## 2023-09-11 DIAGNOSIS — Z1283 Encounter for screening for malignant neoplasm of skin: Secondary | ICD-10-CM | POA: Diagnosis not present

## 2023-09-11 DIAGNOSIS — W908XXA Exposure to other nonionizing radiation, initial encounter: Secondary | ICD-10-CM | POA: Diagnosis not present

## 2023-09-11 DIAGNOSIS — L578 Other skin changes due to chronic exposure to nonionizing radiation: Secondary | ICD-10-CM

## 2023-09-11 DIAGNOSIS — Z872 Personal history of diseases of the skin and subcutaneous tissue: Secondary | ICD-10-CM

## 2023-09-11 DIAGNOSIS — D1801 Hemangioma of skin and subcutaneous tissue: Secondary | ICD-10-CM

## 2023-09-11 DIAGNOSIS — L570 Actinic keratosis: Secondary | ICD-10-CM

## 2023-09-11 DIAGNOSIS — Z85828 Personal history of other malignant neoplasm of skin: Secondary | ICD-10-CM

## 2023-09-11 DIAGNOSIS — Z8589 Personal history of malignant neoplasm of other organs and systems: Secondary | ICD-10-CM

## 2023-09-11 DIAGNOSIS — L821 Other seborrheic keratosis: Secondary | ICD-10-CM

## 2023-09-11 MED ORDER — METOPROLOL SUCCINATE ER 25 MG PO TB24: 25.0000 mg | ORAL_TABLET | Freq: Every day | ORAL | 2 refills | Status: DC

## 2023-09-11 NOTE — Progress Notes (Signed)
 Follow-Up Visit   Subjective  Vershawn Combs is a 63 y.o. male who presents for the following: Skin Cancer Screening and Upper Body Skin Exam. Hx of SCCs. Hx of BCCs. Hx of AKs.  Areas of concern on hands.   The patient presents for Upper Body Skin Exam (UBSE) for skin cancer screening and mole check. The patient has spots, moles and lesions to be evaluated, some may be new or changing and the patient may have concern these could be cancer.  The following portions of the chart were reviewed this encounter and updated as appropriate: medications, allergies, medical history  Review of Systems:  No other skin or systemic complaints except as noted in HPI or Assessment and Plan.  Objective  Well appearing patient in no apparent distress; mood and affect are within normal limits.  All skin waist up examined. Relevant physical exam findings are noted in the Assessment and Plan.  left lateral scapula x1, L deltoid x1, L clavicle x2, L neck x2, R thigh x3, L thigh x2 (11) Erythematous keratotic or waxy stuck-on papule or plaque. Right Hand Dorsum, L hand dorsum x4 (4) Erythematous thin papules/macules with gritty scale.   Assessment & Plan   INFLAMED SEBORRHEIC KERATOSIS (11) left lateral scapula x1, L deltoid x1, L clavicle x2, L neck x2, R thigh x3, L thigh x2 (11) Symptomatic, irritating, patient would like treated.  Recheck left lateral scapula on follow up.  Destruction of lesion - left lateral scapula x1, L deltoid x1, L clavicle x2, L neck x2, R thigh x3, L thigh x2 (11) Complexity: simple   Destruction method: cryotherapy   Informed consent: discussed and consent obtained   Timeout:  patient name, date of birth, surgical site, and procedure verified Lesion destroyed using liquid nitrogen: Yes   Region frozen until ice ball extended beyond lesion: Yes   Outcome: patient tolerated procedure well with no complications   Post-procedure details: wound care instructions given    Additional details:  Prior to procedure, discussed risks of blister formation, small wound, skin dyspigmentation, or rare scar following cryotherapy. Recommend Vaseline ointment to treated areas while healing.  AK (ACTINIC KERATOSIS) (4) Right Hand Dorsum, L hand dorsum x4 (4) Actinic keratoses are precancerous spots that appear secondary to cumulative UV radiation exposure/sun exposure over time. They are chronic with expected duration over 1 year. A portion of actinic keratoses will progress to squamous cell carcinoma of the skin. It is not possible to reliably predict which spots will progress to skin cancer and so treatment is recommended to prevent development of skin cancer.  Recommend daily broad spectrum sunscreen SPF 30+ to sun-exposed areas, reapply every 2 hours as needed.  Recommend staying in the shade or wearing long sleeves, sun glasses (UVA+UVB protection) and wide brim hats (4-inch brim around the entire circumference of the hat). Call for new or changing lesions. Destruction of lesion - Right Hand Dorsum, L hand dorsum x4 (4) Complexity: simple   Destruction method: cryotherapy   Informed consent: discussed and consent obtained   Timeout:  patient name, date of birth, surgical site, and procedure verified Lesion destroyed using liquid nitrogen: Yes   Region frozen until ice ball extended beyond lesion: Yes   Outcome: patient tolerated procedure well with no complications   Post-procedure details: wound care instructions given   Additional details:  Prior to procedure, discussed risks of blister formation, small wound, skin dyspigmentation, or rare scar following cryotherapy. Recommend Vaseline ointment to treated areas while healing.  Skin cancer screening performed today.  HISTORY OF SQUAMOUS CELL CARCINOMA OF THE SKIN - No evidence of recurrence today - No lymphadenopathy - Recommend regular full body skin exams - Recommend daily broad spectrum sunscreen SPF 30+ to  sun-exposed areas, reapply every 2 hours as needed.  - Call if any new or changing lesions are noted between office visits  HISTORY OF BASAL CELL CARCINOMA OF THE SKIN - No evidence of recurrence today - Recommend regular full body skin exams - Recommend daily broad spectrum sunscreen SPF 30+ to sun-exposed areas, reapply every 2 hours as needed.  - Call if any new or changing lesions are noted between office visits  Actinic Damage - Chronic condition, secondary to cumulative UV/sun exposure - diffuse scaly erythematous macules with underlying dyspigmentation - Recommend daily broad spectrum sunscreen SPF 30+ to sun-exposed areas, reapply every 2 hours as needed.  - Staying in the shade or wearing long sleeves, sun glasses (UVA+UVB protection) and wide brim hats (4-inch brim around the entire circumference of the hat) are also recommended for sun protection.  - Call for new or changing lesions.  Lentigines, Seborrheic Keratoses, Hemangiomas - Benign normal skin lesions - Benign-appearing - Call for any changes  Melanocytic Nevi - Tan-brown and/or pink-flesh-colored symmetric macules and papules - Benign appearing on exam today - Observation - Call clinic for new or changing moles - Recommend daily use of broad spectrum spf 30+ sunscreen to sun-exposed areas.   Skin Cancer Screening  Return in about 6 months (around 03/13/2024) for UBSE, AK Follow Up.  I, Darcie Easterly, CMA, am acting as scribe for Curtis Collard, MD.   Documentation: I have reviewed the above documentation for accuracy and completeness, and I agree with the above.  Curtis Collard, MD

## 2023-09-11 NOTE — Patient Instructions (Addendum)

## 2023-09-13 ENCOUNTER — Encounter: Payer: Self-pay | Admitting: Cardiovascular Disease

## 2023-09-13 ENCOUNTER — Ambulatory Visit: Attending: Cardiovascular Disease | Admitting: Cardiovascular Disease

## 2023-09-13 VITALS — BP 124/88 | HR 70 | Ht 65.0 in | Wt 193.6 lb

## 2023-09-13 DIAGNOSIS — R931 Abnormal findings on diagnostic imaging of heart and coronary circulation: Secondary | ICD-10-CM | POA: Insufficient documentation

## 2023-09-13 DIAGNOSIS — E782 Mixed hyperlipidemia: Secondary | ICD-10-CM | POA: Diagnosis not present

## 2023-09-13 DIAGNOSIS — R002 Palpitations: Secondary | ICD-10-CM

## 2023-09-13 DIAGNOSIS — I10 Essential (primary) hypertension: Secondary | ICD-10-CM

## 2023-09-13 NOTE — Progress Notes (Signed)
 09/13/2023 Curtis Combs [facility]   1961-03-29  [id number]  Primary Physician Tasia Farr, FNP Primary Cardiologist: Avanell Leigh MD Bennye Bravo, [place]  HPI:  Curtis Combs is a 63 y.o.  mildly overweight married Caucasian male father of 2, grandfather 6 grandchildren who is accompanied by his wife Curtis Combs today.  He works as a Programmer, multimedia.  I last saw him in the office 06/19/2023.  His risk factors include treated hypertension and untreated hyperlipidemia.  There is no family history of heart disease.  Never had a heart attack or stroke.  He denies chest pain or shortness of breath.  He has had episode of tachycardia palpitations beginning this fall but becoming more frequent.  This morning he woke up and had a heart rate of 200.  He went to the emergency room at which time the episode resolved spontaneously.  He also had an episode of syncope this fall for unclear reasons.  I referred him to Dr. 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  He said no recurrent symptoms.  I did get a 2D echo on him 3//25 that was essentially normal with mildly elevated elevated RV systolic pressure and an aortic root that measured 40 mm.  This will need to be followed prospectively in the future.  I also obtained a coronary calcium  score on him 06/23/2023 which was 614 distributed in all 3 coronary arteries although he is completely asymptomatic.     Current Meds  Medication Sig   atorvastatin  (LIPITOR) 40 MG tablet Take 1 tablet (40 mg total) by mouth daily.   lisinopril  (ZESTRIL ) 5 MG tablet Take 0.5 tablets (2.5 mg total) by mouth daily.   MAGNESIUM GLYCINATE PO Take 2 tablets by mouth at bedtime.   metoprolol  succinate (TOPROL  XL) 25 MG 24 hr tablet Take 1 tablet (25 mg total) by mouth daily.   Multiple Vitamin (MULTIVITAMIN WITH MINERALS) TABS tablet Take 1 tablet by mouth daily.     No Known Allergies  Social History    Socioeconomic History   Marital status: Married    Spouse name: Not on file   Number of children: Not on file   Years of education: Not on file   Highest education level: 12th grade  Occupational History   Not on file  Tobacco Use   Smoking status: Never   Smokeless tobacco: Never  Vaping Use   Vaping status: Never Used  Substance and Sexual Activity   Alcohol use: No   Drug use: No   Sexual activity: Yes    Birth control/protection: Surgical    Comment: Vasectomy  Other Topics Concern   Not on file  Social History Narrative   Not on file   Social Drivers of Health   Financial Resource Strain: Low Risk  (05/05/2023)   Overall Financial Resource Strain (CARDIA)    Difficulty of Paying Living Expenses: Not very hard  Food Insecurity: No Food Insecurity (05/05/2023)   Hunger Vital Sign    Worried About Running Out of Food in the Last Year: Never true    Ran Out of Food in the Last Year: Never true  Transportation Needs: No Transportation Needs (05/05/2023)   PRAPARE - Administrator, Civil Service (Medical): No    Lack of Transportation (Non-Medical): No  Physical Activity: Insufficiently Active (05/05/2023)   Exercise Vital Sign    Days of Exercise per Week: 3 days    Minutes  of Exercise per Session: 30 min  Stress: No Stress Concern Present (05/05/2023)   [employer] of Occupational Health - Occupational Stress Questionnaire    Feeling of Stress : Not at all  Social Connections: Socially Integrated (05/05/2023)   Social Connection and Isolation Panel [NHANES]    Frequency of Communication with Friends and Family: More than three times a week    Frequency of Social Gatherings with Friends and Family: More than three times a week    Attends Religious Services: More than 4 times per year    Active Member of [employer] or Organizations: Yes    Attends Engineer, structural: More than 4 times per year    Marital Status: Married  Catering manager  Violence: Not on file     Review of Systems: General: negative for chills, fever, night sweats or weight changes.  Cardiovascular: negative for chest pain, dyspnea on exertion, edema, orthopnea, palpitations, paroxysmal nocturnal dyspnea or shortness of breath Dermatological: negative for rash Respiratory: negative for cough or wheezing Urologic: negative for hematuria Abdominal: negative for nausea, vomiting, diarrhea, bright red blood per rectum, melena, or hematemesis Neurologic: negative for visual changes, syncope, or dizziness All other systems reviewed and are otherwise negative except as noted above.    Blood pressure 124/88, pulse 70, height 5\' 5"  (1.651 m), weight 193 lb 9.6 oz (87.8 kg), SpO2 95%.  General appearance: alert and no distress Neck: no adenopathy, no carotid bruit, no JVD, supple, symmetrical, trachea midline, and thyroid  not enlarged, symmetric, no tenderness/mass/nodules Lungs: clear to auscultation bilaterally Heart: regular rate and rhythm, S1, S2 normal, no murmur, click, rub or gallop Extremities: extremities normal, atraumatic, no cyanosis or edema Pulses: 2+ and symmetric Skin: Skin color, texture, turgor normal. No rashes or lesions Neurologic: Grossly normal  EKG not performed today      ASSESSMENT AND PLAN:   Hypertension History of essential hypertension her blood pressure measured today at 124/88.  He is on lisinopril  and metoprolol .  Mixed hyperlipidemia History of hyperlipidemia started on atorvastatin  3 months ago.  Lipid profile performed 05/12/2023 revealed total cholesterol 194, LDL 121 HDL 62.  LDL goal less than 70 given his elevated coronary calcium  score.  He is asymptomatic.  He is scheduled to have his lipid profile rechecked at the end of this month.  Intermittent palpitations History of SVT symptomatic.  Referred him to Dr. 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  He said no further episodes and feels  clinically improved.  Elevated coronary artery calcium  score Coronary calcium  score performed 06/23/2023 was 614 distributed in all 3 coronary arteries primarily the LAD.  He is completely asymptomatic.  Based on this we are more aggressive with risk factor modification with an LDL goal of less than 70.     Avanell Leigh MD FACP,FACC,FAHA, [facility] 09/13/2023 5:14 PM

## 2023-09-13 NOTE — Assessment & Plan Note (Signed)
 Coronary calcium  score performed 06/23/2023 was 614 distributed in all 3 coronary arteries primarily the LAD.  He is completely asymptomatic.  Based on this we are more aggressive with risk factor modification with an LDL goal of less than 70.

## 2023-09-13 NOTE — Patient Instructions (Signed)
 Medication Instructions:  Your physician recommends that you continue on your current medications as directed. Please refer to the Current Medication list given to you today.  *If you need a refill on your cardiac medications before your next appointment, please call your pharmacy*  Lab Work: Your physician recommends that you return for lab work in: in May for FASTING lipid/liver panel  If you have labs (blood work) drawn today and your tests are completely normal, you will receive your results only by: MyChart Message (if you have MyChart) OR A paper copy in the mail If you have any lab test that is abnormal or we need to change your treatment, we will call you to review the results.  Follow-Up: At [facility], you and your health needs are our priority.  As part of our continuing mission to provide you with exceptional heart care, our providers are all part of one team.  This team includes your primary Cardiologist (physician) and Advanced Practice Providers or APPs (Physician Assistants and Nurse Practitioners) who all work together to provide you with the care you need, when you need it.  Your next appointment:   6 month(s)  Provider:   Varney Gentleman, PA-C, Cadence Stana Ear, or Morey Ar, NP    Then, Dr. Katheryne Pane in 12 months.  We recommend signing up for the patient portal called "MyChart".  Sign up information is provided on this After Visit Summary.  MyChart is used to connect with patients for Virtual Visits (Telemedicine).  Patients are able to view lab/test results, encounter notes, upcoming appointments, etc.  Non-urgent messages can be sent to your provider as well.   To learn more about what you can do with MyChart, go to [email or web address].

## 2023-09-13 NOTE — Addendum Note (Signed)
 Addended by: Deforest Fast on: 09/13/2023 05:21 PM   Modules accepted: Orders

## 2023-09-13 NOTE — Assessment & Plan Note (Signed)
 History of hyperlipidemia started on atorvastatin  3 months ago.  Lipid profile performed 05/12/2023 revealed total cholesterol 194, LDL 121 HDL 62.  LDL goal less than 70 given his elevated coronary calcium  score.  He is asymptomatic.  He is scheduled to have his lipid profile rechecked at the end of this month.

## 2023-09-13 NOTE — Assessment & Plan Note (Signed)
 History of essential hypertension her blood pressure measured today at 124/88.  He is on lisinopril  and metoprolol .

## 2023-09-13 NOTE — Assessment & Plan Note (Signed)
 History of SVT symptomatic.  Referred him to Dr. 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  He said no further episodes and feels clinically improved.

## 2023-09-14 ENCOUNTER — Ambulatory Visit: Payer: 59 | Admitting: Dermatology

## 2023-09-25 ENCOUNTER — Ambulatory Visit: Admitting: Dermatology

## 2023-10-02 NOTE — Progress Notes (Signed)
 " Electrophysiology Office Note:   Date:  10/03/2023  ID:  Curtis Combs, DOB Oct 08, 1960, MRN [id number]  Primary Cardiologist: None Electrophysiologist: Fonda Kitty, MD      History of Present Illness:   Curtis Combs is a 63 y.o. male with h/o HTN, HLD and SVT s/p AVNRT ablation who is being seen today for post ablation follow up.  Discussed the use of AI scribe software for clinical note transcription with the patient, who gave verbal consent to proceed.  History of Present Illness He has not experienced any episodes of arrhythmia since the recent ablation procedure. The highest recorded heart rate was 150 beats per minute on one occasion, but it was very short. SVT previously occurred at 200bpm.  Most days, his heart rate does not exceed 115 beats per minute. He continues to take metoprolol  without any issues and tolerates it well. He has been monitoring his heart rate and has not experienced any fluttering, skipping, or racing sensations. He has resumed normal activities, including working out in the yard, and feels significantly improved since the procedure. No chest pain or swelling in the legs is reported.  Review of systems complete and found to be negative unless listed in HPI.   EP Information / Studies Reviewed:    EKG is ordered today. Personal review as below.  EKG Interpretation Date/Time:  Tuesday Oct 03 2023 08:43:38 EDT Ventricular Rate:  74 PR Interval:  158 QRS Duration:  72 QT Interval:  378 QTC Calculation: 419 R Axis:   -19  Text Interpretation: Normal sinus rhythm Normal ECG When compared with ECG of 02-Aug-2023 13:52, Premature supraventricular complexes are no longer Present Confirmed by Kitty Fonda [phone]) on 10/03/2023 9:10:01 AM   EKG 06/26/23:     CT Calcium  Score 06/23/23:  IMPRESSION AND RECOMMENDATION: 1. Coronary calcium  score of 614. This was 91st percentile for age and sex matched control. 2. CAC >300 in LAD, LCx, RCA. CAC-DRS  A3/N3. 3. Recommend aspirin and statin if no contraindication. 4. Recommend cardiology consultation. 5. Continue heart healthy lifestyle and risk factor modification.  Echo 07/11/23:   1. Left ventricular ejection fraction, by estimation, is 60 to 65%. The  left ventricle has normal function. The left ventricle has no regional  wall motion abnormalities. Left ventricular diastolic parameters were  normal.   2. Right ventricular systolic function is normal. The right ventricular  size is normal. There is mildly elevated pulmonary artery systolic  pressure. The estimated right ventricular systolic pressure is 37.5 mmHg.   3. The mitral valve is normal in structure. Mild mitral valve  regurgitation. No evidence of mitral stenosis.   4. The aortic valve has an indeterminant number of cusps. Aortic valve  regurgitation is not visualized. No aortic stenosis is present.   5. There is mild dilatation of the aortic root, measuring 40 mm.   6. The inferior vena cava is normal in size with greater than 50%  respiratory variability, suggesting right atrial pressure of 3 mmHg.     Physical Exam:   VS:  BP 112/84   Pulse 75   Ht 5' 5 (1.651 m)   Wt 198 lb 9.6 oz (90.1 kg)   SpO2 96%   BMI 33.05 kg/m    Wt Readings from Last 3 Encounters:  10/03/23 198 lb 9.6 oz (90.1 kg)  09/13/23 193 lb 9.6 oz (87.8 kg)  08/02/23 182 lb (82.6 kg)     GEN: Well nourished, well developed in no acute distress  NECK: No JVD CARDIAC: Normal rate, regular rhythm RESPIRATORY:  Clear to auscultation without rales, wheezing or rhonchi  ABDOMEN: Soft, non-distended EXTREMITIES:  No edema; No deformity   ASSESSMENT AND PLAN:    #AVNRT s/p ablation: No episodes since.  #Syncope: Occurred in setting of rapid SVT.  -Continue metoprolol  XL 25mg  daily. -Continue monitoring heart rates with Apple Watch. EP as needed.     #Hypertension: -At goal today.  Recommend checking blood pressures 1-2 times per week at home  and recording the values.  Recommend bringing these recordings to the primary care physician.   #Hyperlipidemia: Continue atorvastatin  40mg  daily.  Follow up with Dr. Kennyth as needed.   Signed, Fonda Kennyth, MD  "

## 2023-10-03 ENCOUNTER — Ambulatory Visit: Admitting: Cardiology

## 2023-10-03 ENCOUNTER — Encounter: Payer: Self-pay | Admitting: Cardiology

## 2023-10-03 ENCOUNTER — Ambulatory Visit: Attending: Cardiology | Admitting: Cardiology

## 2023-10-03 VITALS — BP 112/84 | HR 75 | Ht 65.0 in | Wt 198.6 lb

## 2023-10-03 DIAGNOSIS — E785 Hyperlipidemia, unspecified: Secondary | ICD-10-CM | POA: Diagnosis not present

## 2023-10-03 DIAGNOSIS — I4719 Other supraventricular tachycardia: Secondary | ICD-10-CM

## 2023-10-03 DIAGNOSIS — I471 Supraventricular tachycardia, unspecified: Secondary | ICD-10-CM | POA: Diagnosis not present

## 2023-10-03 DIAGNOSIS — I10 Essential (primary) hypertension: Secondary | ICD-10-CM | POA: Diagnosis not present

## 2023-10-03 NOTE — Patient Instructions (Signed)

## 2023-10-04 ENCOUNTER — Ambulatory Visit: Payer: Self-pay | Admitting: Cardiology

## 2023-10-04 LAB — HEPATIC FUNCTION PANEL
ALT: 51 IU/L — ABNORMAL HIGH (ref 0–44)
AST: 41 IU/L — ABNORMAL HIGH (ref 0–40)
Albumin: 4.4 g/dL (ref 3.9–4.9)
Alkaline Phosphatase: 96 IU/L (ref 44–121)
Bilirubin Total: 0.6 mg/dL (ref 0.0–1.2)
Bilirubin, Direct: 0.22 mg/dL (ref 0.00–0.40)
Total Protein: 6.8 g/dL (ref 6.0–8.5)

## 2023-10-04 LAB — LIPID PANEL
Chol/HDL Ratio: 2.4 ratio (ref 0.0–5.0)
Cholesterol, Total: 132 mg/dL (ref 100–199)
HDL: 55 mg/dL (ref >39)
LDL Chol Calc (NIH): 62 mg/dL (ref 0–99)
Triglycerides: 75 mg/dL (ref 0–149)
VLDL Cholesterol Cal: 15 mg/dL (ref 5–40)

## 2023-10-04 NOTE — Progress Notes (Signed)
 LFT stable on medical therapy, LDL is now at goal and continue present medications

## 2023-10-16 ENCOUNTER — Encounter: Payer: Self-pay | Admitting: Family Medicine

## 2023-10-16 DIAGNOSIS — Z1211 Encounter for screening for malignant neoplasm of colon: Secondary | ICD-10-CM

## 2023-10-17 NOTE — Telephone Encounter (Signed)
 Covering Provider    Please see the message below and advise

## 2023-10-30 LAB — COLOGUARD: COLOGUARD: NEGATIVE

## 2023-10-31 ENCOUNTER — Ambulatory Visit: Payer: Self-pay | Admitting: Family Medicine

## 2023-11-11 ENCOUNTER — Other Ambulatory Visit: Payer: Self-pay | Admitting: Family Medicine

## 2023-11-11 DIAGNOSIS — I10 Essential (primary) hypertension: Secondary | ICD-10-CM

## 2023-12-10 ENCOUNTER — Encounter: Payer: Self-pay | Admitting: Cardiovascular Disease

## 2023-12-11 MED ORDER — METOPROLOL SUCCINATE ER 25 MG PO TB24: 25.0000 mg | ORAL_TABLET | Freq: Every day | ORAL | 3 refills | Status: AC

## 2023-12-11 NOTE — Addendum Note (Signed)
 Addended by: CHAUVIGNE, Kaiden Pech on: 12/11/2023 11:43 AM   Modules accepted: Orders

## 2024-01-10 ENCOUNTER — Ambulatory Visit (INDEPENDENT_AMBULATORY_CARE_PROVIDER_SITE_OTHER): Admitting: Family Medicine

## 2024-01-10 ENCOUNTER — Encounter: Payer: Self-pay | Admitting: Family Medicine

## 2024-01-10 VITALS — BP 106/67 | HR 79 | Resp 16 | Wt 198.0 lb

## 2024-01-10 DIAGNOSIS — E782 Mixed hyperlipidemia: Secondary | ICD-10-CM

## 2024-01-10 DIAGNOSIS — I471 Supraventricular tachycardia, unspecified: Secondary | ICD-10-CM | POA: Diagnosis not present

## 2024-01-10 DIAGNOSIS — I10 Essential (primary) hypertension: Secondary | ICD-10-CM | POA: Diagnosis not present

## 2024-01-10 NOTE — Progress Notes (Signed)
 Established Patient Office Visit  Introduced to nurse practitioner role and practice setting.  All questions answered.  Discussed provider/patient relationship and expectations.   Subjective   Patient ID: Curtis Combs, male    DOB: [birth year]  Age: 63 y.o. MRN: [id number]  Chief Complaint  Patient presents with   Follow-up    6 month HTN f/u. No other concerns   Discussed the use of AI scribe software for clinical note transcription with the patient, who gave verbal consent to proceed.  History of Present Illness Curtis Combs is a 63 year old male with hypertension and coronary artery disease who presents for medication management and follow-up.  He is currently taking lisinopril  2.5 mg, metoprolol , and atorvastatin  for hypertension, heart rate, and cholesterol management, respectively. His blood pressure is well-controlled, typically around 114/78 mmHg when checked at home. He checks his blood pressure a few times a week and records the readings.  He has a history of high blood pressure and heart rate issues, managed by a cardiologist. No recent chest pain or palpitations. He is unsure about his next cardiology appointment, as the previous one was canceled.  He confirms that he is still taking atorvastatin , despite a note indicating otherwise. He takes three daily medications, including atorvastatin  for cholesterol management.       07/10/2023    3:48 PM 06/12/2023    3:47 PM 05/12/2023    3:51 PM  Depression screen PHQ 2/9  Decreased Interest 0 0 0  Down, Depressed, Hopeless 0 0 0  PHQ - 2 Score 0 0 0  Altered sleeping 0 0   Tired, decreased energy 0 0   Change in appetite 0 0   Feeling bad or failure about yourself  0 0   Trouble concentrating 0 0   Moving slowly or fidgety/restless 0 0   Suicidal thoughts 0 0   PHQ-9 Score 0 0   Difficult doing work/chores  Not difficult at all        07/10/2023    3:48 PM 05/12/2023    3:51 PM  GAD 7 : Generalized  Anxiety Score  Nervous, Anxious, on Edge 0 0  Control/stop worrying 0 0  Worry too much - different things 0 0  Trouble relaxing 0 0  Restless 0 0  Easily annoyed or irritable 0 0  Afraid - awful might happen 0 0  Total GAD 7 Score 0 0  Anxiety Difficulty  Not difficult at all     ROS  Negative unless indicated in HPI   Objective:     BP 106/67 (BP Location: Right Arm, Patient Position: Sitting, Cuff Size: Normal)   Pulse 79   Resp 16   Wt 198 lb (89.8 kg)   SpO2 97%   BMI 32.95 kg/m    Physical Exam Constitutional:      General: He is not in acute distress.    Appearance: Normal appearance. He is not ill-appearing, toxic-appearing or diaphoretic.  HENT:     Head: Normocephalic.     Nose: Nose normal.     Mouth/Throat:     Mouth: Mucous membranes are moist.  Eyes:     Extraocular Movements: Extraocular movements intact.     Conjunctiva/sclera: Conjunctivae normal.     Pupils: Pupils are equal, round, and reactive to light.  Neck:     Vascular: No carotid bruit.  Cardiovascular:     Rate and Rhythm: Normal rate and regular rhythm.     Pulses:  Radial pulses are 2+ on the right side and 2+ on the left side.       Dorsalis pedis pulses are 2+ on the right side and 2+ on the left side.       Posterior tibial pulses are 2+ on the right side and 2+ on the left side.     Heart sounds: No murmur heard.    No friction rub. No gallop.  Pulmonary:     Effort: Pulmonary effort is normal. No respiratory distress.     Breath sounds: Normal breath sounds. No stridor. No wheezing, rhonchi or rales.  Chest:     Chest wall: No tenderness.  Musculoskeletal:     Cervical back: No rigidity or tenderness.     Right lower leg: No edema.     Left lower leg: No edema.  Lymphadenopathy:     Cervical: No cervical adenopathy.  Skin:    General: Skin is warm and dry.     Capillary Refill: Capillary refill takes less than 2 seconds.  Neurological:     General: No focal  deficit present.     Mental Status: He is alert and oriented to person, place, and time. Mental status is at baseline.     Cranial Nerves: No cranial nerve deficit.     Sensory: No sensory deficit.     Motor: No weakness.     Coordination: Coordination normal.     Gait: Gait normal.  Psychiatric:        Mood and Affect: Mood normal.        Behavior: Behavior normal.        Thought Content: Thought content normal.        Judgment: Judgment normal.      No results found for any visits on 01/10/24.    The 10-year ASCVD risk score (Arnett DK, et al., 2019) is: 5.7%    Assessment & Plan:  Primary hypertension -     Basic metabolic panel with GFR  Mixed hyperlipidemia  SVT (supraventricular tachycardia) (HCC)    Assessment and Plan Assessment & Plan Essential hypertension Blood pressure is well-controlled on lisinopril  and metoprolol . Considering discontinuation of lisinopril  to assess control with metoprolol  alone given hx of tachycardia, SVT. No symptoms of chest pain or palpitations. Interested in reducing medication load. GOAL<120/80 - Hold lisinopril  2.5mg  starting tomorrow - Continue metoprolol  25mg  daily for heart rate and BP control - Monitor blood pressure daily for next two weeks, bring log in - Return for follow-up in two to three weeks to reassess blood pressure - Restart lisinopril  if blood pressure readings exceed 120/80 mmHg - continue lifestyle management - low sodium, exercise, well balanced diet  Hyperlipidemia Continue Atorvastatin  - UTD and controlled lipid panel  Hx of SVT - Cardiology following, had ablation - Denies chest pain, palpitations, swelling, fluttering, or heart beat skipping - Continue metoprolol  25mg  DAILY - f/u wth cardiology for medication mgmt.    Return in about 2 weeks (around 01/24/2024).   I, Curtis DELENA Boom, FNP, have reviewed all documentation for this visit. The documentation on 01/10/24 for the exam, diagnosis,  procedures, and orders are all accurate and complete.   Curtis DELENA Boom, FNP

## 2024-01-10 NOTE — Patient Instructions (Signed)
 HOLD Lisinopril , check BP every morning - make log to bring to next visit

## 2024-01-12 ENCOUNTER — Ambulatory Visit: Payer: Self-pay | Admitting: Family Medicine

## 2024-01-12 LAB — BASIC METABOLIC PANEL WITH GFR
BUN/Creatinine Ratio: 12 (ref 10–24)
BUN: 14 mg/dL (ref 8–27)
CO2: 18 mmol/L — ABNORMAL LOW (ref 20–29)
Calcium: 9.7 mg/dL (ref 8.6–10.2)
Chloride: 103 mmol/L (ref 96–106)
Creatinine, Ser: 1.15 mg/dL (ref 0.76–1.27)
Glucose: 107 mg/dL — ABNORMAL HIGH (ref 70–99)
Potassium: 4.7 mmol/L (ref 3.5–5.2)
Sodium: 138 mmol/L (ref 134–144)
eGFR: 72 mL/min/1.73 (ref >59)

## 2024-02-01 ENCOUNTER — Ambulatory Visit (INDEPENDENT_AMBULATORY_CARE_PROVIDER_SITE_OTHER): Admitting: Family Medicine

## 2024-02-01 ENCOUNTER — Encounter: Payer: Self-pay | Admitting: Family Medicine

## 2024-02-01 VITALS — BP 132/80 | HR 66 | Temp 97.8°F | Ht 65.0 in | Wt 203.1 lb

## 2024-02-01 DIAGNOSIS — Z23 Encounter for immunization: Secondary | ICD-10-CM

## 2024-02-01 DIAGNOSIS — I10 Essential (primary) hypertension: Secondary | ICD-10-CM | POA: Diagnosis not present

## 2024-02-01 DIAGNOSIS — E782 Mixed hyperlipidemia: Secondary | ICD-10-CM

## 2024-02-01 NOTE — Progress Notes (Signed)
 Established Patient Office Visit  Introduced to nurse practitioner role and practice setting.  All questions answered.  Discussed provider/patient relationship and expectations.  Subjective   Patient ID: Curtis Combs, male    DOB: 1961/03/09  Age: 63 y.o. MRN: [id number]  Chief Complaint  Patient presents with   Hypertension    -Patient reports that he is here for a follow up on his blood pressure -Walks a little bit but not much -Reports he is following a low salt diet -Monitors blood pressure for the past 2 weeks at home this morning 105/76.  Pneumococcal Vaccine- declined    Discussed the use of AI scribe software for clinical note transcription with the patient, who gave verbal consent to proceed.  History of Present Illness Curtis Combs is a 63 year old male with hypertension and hyperlipidemia who presents for medication management and follow-up.  His blood pressure readings at home have been consistently between 102-111 mmHg systolic and 76-81 mmHg diastolic. He is currently taking metoprolol .  He has been on statin therapy since February, which has improved his cholesterol levels, with LDL cholesterol decreasing from 121 to 62.  He is concerned about potential long-term effects of statins on liver and kidney health, influenced by information from a nutritionist named Arley Kussmaul. He feels tired sometimes but denies other side effects. His father, who is 43 years old, has been on statins for some time. He wants to avoid potential health issues related to statin use, similar to his mother's liver problems. He is considering lifestyle changes and is open to discussing his concerns with a cardiologist during his upcoming appointment on November 18th.  No episodes of syncope.      02/01/2024    3:09 PM 07/10/2023    3:48 PM 06/12/2023    3:47 PM  Depression screen PHQ 2/9  Decreased Interest 0 0 0  Down, Depressed, Hopeless 0 0 0  PHQ - 2 Score 0 0 0   Altered sleeping 1 0 0  Tired, decreased energy 0 0 0  Change in appetite 0 0 0  Feeling bad or failure about yourself  0 0 0  Trouble concentrating 0 0 0  Moving slowly or fidgety/restless 0 0 0  Suicidal thoughts 0 0 0  PHQ-9 Score 1 0 0  Difficult doing work/chores Not difficult at all  Not difficult at all       02/01/2024    3:09 PM 07/10/2023    3:48 PM 05/12/2023    3:51 PM  GAD 7 : Generalized Anxiety Score  Nervous, Anxious, on Edge 1 0 0  Control/stop worrying 0 0 0  Worry too much - different things 0 0 0  Trouble relaxing 0 0 0  Restless 0 0 0  Easily annoyed or irritable 0 0 0  Afraid - awful might happen 0 0 0  Total GAD 7 Score 1 0 0  Anxiety Difficulty Not difficult at all  Not difficult at all     ROS  Negative unless indicated in HPI   Objective:     BP 132/80 (BP Location: Right Arm, Patient Position: Sitting, Cuff Size: Normal)   Pulse 66   Temp 97.8 F (36.6 C) (Oral)   Ht 5' 5 (1.651 m)   Wt 203 lb 1.6 oz (92.1 kg)   SpO2 97%   BMI 33.80 kg/m    Physical Exam Constitutional:      General: He is not in acute distress.  Appearance: Normal appearance. He is obese. He is not ill-appearing, toxic-appearing or diaphoretic.  HENT:     Head: Normocephalic.     Nose: Nose normal.     Mouth/Throat:     Mouth: Mucous membranes are moist.  Eyes:     Extraocular Movements: Extraocular movements intact.     Conjunctiva/sclera: Conjunctivae normal.     Pupils: Pupils are equal, round, and reactive to light.  Neck:     Vascular: No carotid bruit.  Cardiovascular:     Rate and Rhythm: Normal rate and regular rhythm.     Heart sounds: No murmur heard.    No friction rub. No gallop.  Pulmonary:     Effort: Pulmonary effort is normal. No respiratory distress.     Breath sounds: Normal breath sounds. No stridor. No wheezing, rhonchi or rales.  Chest:     Chest wall: No tenderness.  Musculoskeletal:     Right lower leg: No edema.     Left lower  leg: No edema.  Skin:    General: Skin is warm and dry.     Capillary Refill: Capillary refill takes less than 2 seconds.  Neurological:     General: No focal deficit present.     Mental Status: He is alert and oriented to person, place, and time. Mental status is at baseline.     Cranial Nerves: No cranial nerve deficit.     Sensory: No sensory deficit.     Motor: No weakness.     Coordination: Coordination normal.     Gait: Gait normal.  Psychiatric:        Mood and Affect: Mood normal.        Behavior: Behavior normal.        Thought Content: Thought content normal.        Judgment: Judgment normal.      No results found for any visits on 02/01/24.    The 10-year ASCVD risk score (Arnett DK, et al., 2019) is: 8.3%    Assessment & Plan:  Primary hypertension  Mixed hyperlipidemia  Immunization due -     Flu vaccine trivalent PF, 6mos and older(Flulaval,Afluria,Fluarix,Fluzone)     Assessment and Plan Assessment & Plan Hypertension - Elevated in office - has held lisinopril  2.5mg  since 01/10/24 Blood pressure is well-controlled at home with metoprolol  25mg  daily, with home readings ranging from 102 to 111 systolic and 76 to 81 diastolic.  - No symptoms such as headaches, vision changes, or chest pain reported. - Continue metoprolol . - Continue to Hold lisinopril  given at home readings well controlled - Monitor blood pressure two to three times a week. - If at home BP >130/80 low threshold to restart lisinopril   Hyperlipidemia Cholesterol levels have improved with statin therapy since February. LDL has decreased, HDL remains stable. Cardiovascular risk score was 7.5% before starting statin today 8.3% (most likely due to elevated in office BP), indicating benefit from therapy. Concerns about long-term effects of statins on liver and kidneys, influenced by external information/media. No significant side effects reported, except occasional tiredness. - Continue  atorvastatin  40mg  given risk - pt shared decision making agreed - Discuss concerns with cardiologist during follow-up on November 18th. - Consider repeating lipid panel at next visit - Encourage lifestyle modifications including diet and exercise.  General Health Maintenance Discussion about flu vaccination. Declined pneumonia vaccine. - Administer flu vaccine.  The 10-year ASCVD risk score (Arnett DK, et al., 2019) is: 8.3%   Values used to calculate the  score:     Age: 84 years     Clincally relevant sex: Male     Is Non-Hispanic African American: No     Diabetic: No     Tobacco smoker: No     Systolic Blood Pressure: 132 mmHg     Is BP treated: Yes     HDL Cholesterol: 55 mg/dL     Total Cholesterol: 132 mg/dL   Return in about 6 months (around 07/31/2024) for HTN Check and Labs.   I, Curtis DELENA Boom, FNP, have reviewed all documentation for this visit. The documentation on 02/01/24 for the exam, diagnosis, procedures, and orders are all accurate and complete.   Curtis DELENA Boom, FNP

## 2024-03-13 ENCOUNTER — Ambulatory Visit: Admitting: Dermatology

## 2024-03-25 NOTE — Progress Notes (Addendum)
 "  Cardiology Clinic Note   Date: 03/26/2024 ID: Curtis Combs, DOB 05/22/1960, MRN [id number]  [facility] Providers Cardiologist:  Dorn Lesches, MD Electrophysiologist:  Fonda Kitty, MD     Chief Complaint   Curtis Combs is a 63 y.o. male who presents to the clinic today for routine follow up.   Patient Profile   Curtis Combs is followed by Dr. Lesches and Dr. Kitty for the history outlined below.      Past medical history significant for: CAD. CT cardiac scoring 06/23/2023: Coronary calcium  score 614 (91st percentile).  Recommend aspirin and statin. Palpitations/SVT. Echo 07/11/2023: EF 60 to 65%.  No RWMA.  Normal diastolic parameters.  Normal RV size/function.  Mildly elevated PA pressure, RVSP 37.5 mmHg.  Mild MR.  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 SVT ablation 08/02/2023. Hypertension. Hyperlipidemia. Lipid panel 10/03/2023: LDL 62, HDL 55, TG 79, total 132. Syncope.  In summary, patient establish care with Dr. Lesches on 06/19/2023 for palpitations.  Patient reported a several month history tachypalpitations becoming more frequent.  The morning of his visit he woke with a heart rate of 200 bpm and presented to the ED at which time the episode resolved spontaneously.  He also reported an episode of syncope in the fall for unclear reasons.  EKG at the time of his visit demonstrated sinus rhythm with PACs.  He was instructed to stop drinking coffee a 2-week Zio and echo were ordered.  Patient presented to the ED on 06/26/2023 with palpitations and elevated heart rate.  EKG demonstrated SVT heart rate 219 bpm.  Patient received adenosine  x 2 with return to NSR.  Patient was evaluated by Dr. Kitty on 07/04/2023.  Given documented sustained SVT outpatient monitoring was deferred.  Patient underwent SVT ablation on 08/02/2023.  Patient was seen in follow-up by Dr. Lesches on 09/13/2023.  Given his elevated calcium  score aggressive risk factor modification  was recommended with LDL goal <70.  Patient followed up with Dr. Kitty on 10/03/2023.  He reported highest recorded heart rate 150 bpm on 1 occasion and was very brief.  He was tolerating metoprolol  well.  He denied any palpitation.  He was instructed to continue monitoring heart rate with smart watch and follow-up with EP as needed.     History of Present Illness    Today, patient is doing well. Patient denies shortness of breath, dyspnea on exertion, lower extremity edema, orthopnea or PND. No chest pain, pressure, or tightness. No palpitations.  He reports stopping anti-hypertensive recently secondary to well controlled BP. He reports being very sensitive to elevated BP and develops a headache and flushing if his BP is elevated. Home BP is consistently <130/80 unless he is under stress. BP this morning 118/81. He is inconsistent with exercise. He has a home gym. He and his wife try to eat a hearty healthy diet. He would like to transfer his care to this office, as it is more convenient for him.     ROS: All other systems reviewed and are otherwise negative except as noted in History of Present Illness.  EKGs/Labs Reviewed    EKG Interpretation Date/Time:  Tuesday March 26 2024 15:59:24 EST Ventricular Rate:  68 PR Interval:  172 QRS Duration:  74 QT Interval:  408 QTC Calculation: 433 R Axis:   -25  Text Interpretation: Sinus rhythm with Premature atrial complexes When compared with ECG of 03-Oct-2023 08:43, Premature atrial complexes are now Present Confirmed by Loistine Sober [phone])  on 03/26/2024 4:21:08 PM   10/03/2023: ALT 51; AST 41 01/11/2024: BUN 14; Creatinine, Ser 1.15; Potassium 4.7; Sodium 138   07/04/2023: Hemoglobin 16.1; WBC 10.2   06/26/2023: TSH 3.571    Physical Exam    VS:  BP 128/82 (BP Location: Left Arm, Patient Position: Sitting, Cuff Size: Normal)   Pulse 68   Ht 5' 5 (1.651 m)   Wt 201 lb 3.2 oz (91.3 kg)   SpO2 97%   BMI 33.48 kg/m  , BMI Body  mass index is 33.48 kg/m.  GEN: Well nourished, well developed, in no acute distress. Neck: No JVD or carotid bruits. Cardiac:  RRR.  No murmur. No rubs or gallops.   Respiratory:  Respirations regular and unlabored. Clear to auscultation without rales, wheezing or rhonchi. GI: Soft, nontender, nondistended. Extremities: Radials/DP/PT 2+ and equal bilaterally. No clubbing or cyanosis. No edema  Skin: Warm and dry, no rash. Neuro: Strength intact.  Assessment & Plan   CAD Coronary calcium  score 614 February 2025.  Patient denies chest pain, pressure or tightness. He has not started taking aspirin, as he was not sure if he should. Given elevated calcium  score he will add it to his daily medications.  - Add aspirin 81 mg daily.  - Continue Toprol , atorvastatin .  Aortic root dilatation Echo March 2025 demonstrated normal LV/RV function, mildly elevated PA pressure, mild MR, mild dilatation of aortic root 40 mm.  Patient denies chest pain, pressure or tightness. No lightheadedness, dizziness, presyncope or syncope.  - Repeat echo March 2026.  Palpitations/SVT S/p SVT ablation 08/02/2023.  Patient denies palpitations. EKG sinus rhythm with PACs.  - Continue Toprol .  Hypertension BP today 145/90 on intake and 128/82 on my recheck. He checks his BP about 2 times a month and then periodically if he feels it is elevated. He recently stopped anti-hypertensive as his BP was well controlled. Home BP consistently <130/80, this morning it was 118/81. If  his BP is elevated he feels flushed and will get a headache.  - Check BP at least 2 times a week.  - Continue Toprol .  Hyperlipidemia LDL 62 May 2025, at goal. - Continue atorvastatin .  Disposition: Add aspirin 81 mg daily. Return in 6 months or sooner as needed. Patient would like to transfer care here, as it is closer to his home. He will follow up with Dr. Argentina.   Addendum: Patient has a public librarian and requires DOT clearance. He  does not have a history of MI, angina or heart failure. He has normal LV function per echo in March 2025. He does not report any concerning cardiac symptoms.          Signed, Barnie HERO. Derrell Milanes, DNP, NP-C  "

## 2024-03-26 ENCOUNTER — Ambulatory Visit: Attending: Student | Admitting: Student

## 2024-03-26 ENCOUNTER — Encounter: Payer: Self-pay | Admitting: Student

## 2024-03-26 VITALS — BP 128/82 | HR 68 | Ht 65.0 in | Wt 201.2 lb

## 2024-03-26 DIAGNOSIS — I77810 Thoracic aortic ectasia: Secondary | ICD-10-CM | POA: Diagnosis not present

## 2024-03-26 DIAGNOSIS — I471 Supraventricular tachycardia, unspecified: Secondary | ICD-10-CM | POA: Diagnosis not present

## 2024-03-26 DIAGNOSIS — I10 Essential (primary) hypertension: Secondary | ICD-10-CM

## 2024-03-26 DIAGNOSIS — R002 Palpitations: Secondary | ICD-10-CM

## 2024-03-26 DIAGNOSIS — I2510 Atherosclerotic heart disease of native coronary artery without angina pectoris: Secondary | ICD-10-CM

## 2024-03-26 DIAGNOSIS — E785 Hyperlipidemia, unspecified: Secondary | ICD-10-CM

## 2024-03-26 NOTE — Patient Instructions (Addendum)
 Medication Instructions:   Your physician recommends that you continue on your current medications as directed. Please refer to the Current Medication list given to you today.  *If you need a refill on your cardiac medications before your next appointment, please call your pharmacy*  Lab Work:  No labs ordered today   If you have labs (blood work) drawn today and your tests are completely normal, you will receive your results only by: MyChart Message (if you have MyChart) OR A paper copy in the mail If you have any lab test that is abnormal or we need to change your treatment, we will call you to review the results.  Testing/Procedures:  No test ordered today   Follow-Up: At [facility], you and your health needs are our priority.  As part of our continuing mission to provide you with exceptional heart care, our providers are all part of one team.  This team includes your primary Cardiologist (physician) and Advanced Practice Providers or APPs (Physician Assistants and Nurse Practitioners) who all work together to provide you with the care you need, when you need it.  Your next appointment:  6 month(s)  Provider:  Caron Poser, MD    We recommend signing up for the patient portal called MyChart.  Sign up information is provided on this After Visit Summary.  MyChart is used to connect with patients for Virtual Visits (Telemedicine).  Patients are able to view lab/test results, encounter notes, upcoming appointments, etc.  Non-urgent messages can be sent to your provider as well.   To learn more about what you can do with MyChart, go to [email or web address].

## 2024-03-27 ENCOUNTER — Ambulatory Visit: Admitting: Dermatology

## 2024-03-27 DIAGNOSIS — L578 Other skin changes due to chronic exposure to nonionizing radiation: Secondary | ICD-10-CM

## 2024-03-27 DIAGNOSIS — L821 Other seborrheic keratosis: Secondary | ICD-10-CM | POA: Diagnosis not present

## 2024-03-27 DIAGNOSIS — D229 Melanocytic nevi, unspecified: Secondary | ICD-10-CM

## 2024-03-27 DIAGNOSIS — L570 Actinic keratosis: Secondary | ICD-10-CM | POA: Diagnosis not present

## 2024-03-27 DIAGNOSIS — L814 Other melanin hyperpigmentation: Secondary | ICD-10-CM

## 2024-03-27 DIAGNOSIS — Z1283 Encounter for screening for malignant neoplasm of skin: Secondary | ICD-10-CM

## 2024-03-27 DIAGNOSIS — D1801 Hemangioma of skin and subcutaneous tissue: Secondary | ICD-10-CM

## 2024-03-27 DIAGNOSIS — W908XXA Exposure to other nonionizing radiation, initial encounter: Secondary | ICD-10-CM | POA: Diagnosis not present

## 2024-03-27 DIAGNOSIS — Z8589 Personal history of malignant neoplasm of other organs and systems: Secondary | ICD-10-CM

## 2024-03-27 DIAGNOSIS — L820 Inflamed seborrheic keratosis: Secondary | ICD-10-CM | POA: Diagnosis not present

## 2024-03-27 DIAGNOSIS — Z79899 Other long term (current) drug therapy: Secondary | ICD-10-CM

## 2024-03-27 DIAGNOSIS — Z5111 Encounter for antineoplastic chemotherapy: Secondary | ICD-10-CM

## 2024-03-27 DIAGNOSIS — Z85828 Personal history of other malignant neoplasm of skin: Secondary | ICD-10-CM

## 2024-03-27 DIAGNOSIS — Z7189 Other specified counseling: Secondary | ICD-10-CM

## 2024-03-27 DIAGNOSIS — Z86007 Personal history of in-situ neoplasm of skin: Secondary | ICD-10-CM

## 2024-03-27 NOTE — Progress Notes (Unsigned)
 Follow-Up Visit   Subjective  Curtis Combs is a 63 y.o. male who presents for the following: Skin Cancer Screening and Upper Body Skin Exam 6 month ubse  Hx of scc, hx of sccis, hx of bcc, hx of aks and isks.   Patient reports some spots on arms, ear, legs and left cheek  The patient presents for Upper Body Skin Exam (UBSE) for skin cancer screening and mole check. The patient has spots, moles and lesions to be evaluated, some may be new or changing and the patient may have concern these could be cancer.  The following portions of the chart were reviewed this encounter and updated as appropriate: medications, allergies, medical history  Review of Systems:  No other skin or systemic complaints except as noted in HPI or Assessment and Plan.  Objective  Well appearing patient in no apparent distress; mood and affect are within normal limits.  All skin waist up examined. Relevant physical exam findings are noted in the Assessment and Plan.  face and ears x 8 (8) Erythematous thin papules/macules with gritty scale.  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  - Call if any new or changing lesions are noted between office visits   HISTORY OF BASAL CELL CARCINOMA OF THE SKIN - No evidence of recurrence today - Recommend regular full body skin exams - Recommend daily broad spectrum sunscreen SPF 30+ to sun-exposed areas, reapply every 2 hours as needed.  - Call if any new or changing lesions are noted between office visits   ACTINIC  KERATOSIS (8) face and ears x 8 (8) Start January 2,  5-fluorouracil/calcipotriene cream twice a day for 7 days to affected areas including forehead . Prescription sent to [facility]. Patient advised they will receive an email to purchase the medication online and have it sent to their home. Patient provided with handout reviewing treatment course and side effects and advised to call or message us  on MyChart with any concerns.  Reviewed course of treatment and expected reaction.  Patient advised to expect inflammation and crusting and advised that erosions are possible.  Patient advised to be diligent with sun protection during and after treatment. Counseled to keep medication out of reach of children and pets.  ACTINIC DAMAGE WITH PRECANCEROUS ACTINIC KERATOSES Counseling for Topical Chemotherapy Management: Patient exhibits: - Severe, confluent actinic changes with pre-cancerous actinic keratoses that is secondary to cumulative UV radiation exposure over time - Condition that is severe; chronic, not at goal. - diffuse scaly erythematous macules and papules with underlying dyspigmentation - Discussed Prescription Field Treatment topical Chemotherapy for Severe, Chronic Confluent Actinic Changes with Pre-Cancerous Actinic Keratoses Field treatment involves treatment of an entire area of skin that has confluent Actinic Changes (Sun/ Ultraviolet light damage) and PreCancerous Actinic Keratoses by method of PhotoDynamic Therapy (PDT) and/or prescription Topical Chemotherapy agents such as 5-fluorouracil, 5-fluorouracil/calcipotriene, and/or imiquimod.  The purpose is to decrease the number of clinically evident and subclinical PreCancerous lesions to prevent progression to development of skin cancer by  chemically destroying early precancer changes that may or may not be visible.  It has been shown to reduce the risk of developing skin cancer in the treated area. As a result of  treatment, redness, scaling, crusting, and open sores may occur during treatment course. One or more than one of these methods may be used and may have to be used several times to control, suppress and eliminate the PreCancerous changes. Discussed treatment course, expected reaction, and possible side effects. - Recommend daily broad spectrum sunscreen SPF 30+ to sun-exposed areas, reapply every 2 hours as needed.  - Staying in the shade or wearing long sleeves, sun glasses (UVA+UVB protection) and wide brim hats (4-inch brim around the entire circumference of the hat) are also recommended. - Call for new or changing lesions.  Actinic keratoses are precancerous spots that appear secondary to cumulative UV radiation exposure/sun exposure over time. They are chronic with expected duration over 1 year. A portion of actinic keratoses will progress to squamous cell carcinoma of the skin. It is not possible to reliably predict which spots will progress to skin cancer and so treatment is recommended to prevent development of skin cancer.  Recommend daily broad spectrum sunscreen SPF 30+ to sun-exposed areas, reapply every 2 hours as needed.  Recommend staying in the shade or wearing long sleeves, sun glasses (UVA+UVB protection) and wide brim hats (4-inch brim around the entire circumference of the hat). Call for new or changing lesions. Destruction of lesion - face and ears x 8 (8) Complexity: simple   Destruction method: cryotherapy   Informed consent: discussed and consent obtained   Timeout:  patient name, date of birth, surgical site, and procedure verified Lesion destroyed using liquid nitrogen: Yes   Region frozen until ice ball extended beyond lesion: Yes   Outcome: patient tolerated procedure well with no complications   Post-procedure details: wound care instructions given    HISTORY OF SQUAMOUS CELL CARCINOMA   HISTORY OF BASAL CELL CARCINOMA   ACTINIC SKIN DAMAGE   CHEMOTHERAPY  MANAGEMENT, ENCOUNTER FOR   COUNSELING AND COORDINATION OF CARE   MEDICATION MANAGEMENT   SKIN CANCER SCREENING   LENTIGO   MELANOCYTIC NEVUS, UNSPECIFIED LOCATION   INFLAMED SEBORRHEIC KERATOSIS (6) left forearm x 1, right hand x 4 (5), right volar forearm x 1 Symptomatic, irritating, patient would like treated.  Patient advised if spots are not resolved after 2 months to give office a call or send mychart  Will recheck in 6 months  Destruction of lesion - left forearm x 1, right hand x 4 (5), right volar forearm x 1 Complexity: simple   Destruction method: cryotherapy   Informed consent: discussed and consent obtained   Timeout:  patient name, date of birth, surgical site, and procedure verified Lesion destroyed using liquid nitrogen: Yes   Region frozen until ice ball extended beyond lesion: Yes   Outcome: patient tolerated procedure well with no complications   Post-procedure details: wound care instructions given      Skin cancer screening performed today.   Lentigines, Seborrheic Keratoses, Hemangiomas - Benign normal skin lesions - Benign-appearing - Call for any changes  Melanocytic Nevi - Tan-brown and/or pink-flesh-colored symmetric macules and papules - Benign appearing on exam today - Observation - Call clinic for new or changing moles - Recommend daily use of broad spectrum spf 30+ sunscreen to sun-exposed areas.    Return for 6 month tbse .  IEleanor Blush, CMA, am acting as scribe for Alm Rhyme,  MD.   Documentation: I have reviewed the above documentation for accuracy and completeness, and I agree with the above.  Alm Rhyme, MD

## 2024-03-27 NOTE — Patient Instructions (Addendum)
 Start in January 2 - Start 5-fluorouracil/calcipotriene cream twice a day for 7 days to affected areas including forehead . Prescription sent to [facility]. Patient advised they will receive an email to purchase the medication online and have it sent to their home. Patient provided with handout reviewing treatment course and side effects and advised to call or message us  on MyChart with any concerns.  Reviewed course of treatment and expected reaction.  Patient advised to expect inflammation and crusting and advised that erosions are possible.  Patient advised to be diligent with sun protection during and after treatment. Counseled to keep medication out of reach of children and pets.   Instructions for [facility]  One or more of your medications was sent to the [facility]. You will receive an email from them and can purchase the medicine through that link. It will then be mailed to your home at the address you confirmed. If for any reason you do not receive an email from them, please check your spam folder. If you still do not find the email, please let us  know. [facility] phone number is [phone].    5-Fluorouracil/Calcipotriene Patient Education   Actinic keratoses are the dry, red scaly spots on the skin caused by sun damage. A portion of these spots can turn into skin cancer with time, and treating them can help prevent development of skin cancer.   Treatment of these spots requires removal of the defective skin cells. There are various ways to remove actinic keratoses, including freezing with liquid nitrogen, treatment with creams, or treatment with a blue light procedure in the office.   5-fluorouracil cream is a topical cream used to treat actinic keratoses. It works by interfering with the growth of abnormal fast-growing skin cells, such as actinic keratoses. These cells peel off and are replaced by  healthy ones.   5-fluorouracil/calcipotriene is a combination of the 5-fluorouracil cream with a vitamin D  analog cream called calcipotriene. The calcipotriene alone does not treat actinic keratoses. However, when it is combined with 5-fluorouracil, it helps the 5-fluorouracil treat the actinic keratoses much faster so that the same results can be achieved with a much shorter treatment time.  INSTRUCTIONS FOR 5-FLUOROURACIL/CALCIPOTRIENE CREAM:   5-fluorouracil/calcipotriene cream typically only needs to be used for 4-7 days. A thin layer should be applied twice a day to the treatment areas recommended by your physician.   If your physician prescribed you separate tubes of 5-fluourouracil and calcipotriene, apply a thin layer of 5-fluorouracil followed by a thin layer of calcipotriene.   Avoid contact with your eyes, nostrils, and mouth. Do not use 5-fluorouracil/calcipotriene cream on infected or open wounds.   You will develop redness, irritation and some crusting at areas where you have pre-cancer damage/actinic keratoses. IF YOU DEVELOP PAIN, BLEEDING, OR SIGNIFICANT CRUSTING, STOP THE TREATMENT EARLY - you have already gotten a good response and the actinic keratoses should clear up well.  Wash your hands after applying 5-fluorouracil 5% cream on your skin.   A moisturizer or sunscreen with a minimum SPF 30 should be applied each morning.   Once you have finished the treatment, you can apply a thin layer of Vaseline twice a day to irritated areas to soothe and calm the areas more quickly. If you experience significant discomfort, contact your physician.  For some patients it is necessary to repeat the treatment for best results.  SIDE EFFECTS: When using 5-fluorouracil/calcipotriene cream, you may have mild  irritation, such as redness, dryness, swelling, or a mild burning sensation. This usually resolves within 2 weeks. The more actinic keratoses you have, the more redness and inflammation  you can expect during treatment. Eye irritation has been reported rarely. If this occurs, please let us  know.  If you have any trouble using this cream, please call the office. If you have any other questions about this information, please do not hesitate to ask me before you leave the office.   Actinic keratoses are precancerous spots that appear secondary to cumulative UV radiation exposure/sun exposure over time. They are chronic with expected duration over 1 year. A portion of actinic keratoses will progress to squamous cell carcinoma of the skin. It is not possible to reliably predict which spots will progress to skin cancer and so treatment is recommended to prevent development of skin cancer.  Recommend daily broad spectrum sunscreen SPF 30+ to sun-exposed areas, reapply every 2 hours as needed.  Recommend staying in the shade or wearing long sleeves, sun glasses (UVA+UVB protection) and wide brim hats (4-inch brim around the entire circumference of the hat). Call for new or changing lesions.   Cryotherapy Aftercare  Wash gently with soap and water everyday.   Apply Vaseline and Band-Aid daily until healed.   Melanoma ABCDEs  Melanoma is the most dangerous type of skin cancer, and is the leading cause of death from skin disease.  You are more likely to develop melanoma if you: Have light-colored skin, light-colored eyes, or red or blond hair Spend a lot of time in the sun Tan regularly, either outdoors or in a tanning bed Have had blistering sunburns, especially during childhood Have a close family member who has had a melanoma Have atypical moles or large birthmarks  Early detection of melanoma is key since treatment is typically straightforward and cure rates are extremely high if we catch it early.   The first sign of melanoma is often a change in a mole or a new dark spot.  The ABCDE system is a way of remembering the signs of melanoma.  A for asymmetry:  The two halves do not  match. B for border:  The edges of the growth are irregular. C for color:  A mixture of colors are present instead of an even brown color. D for diameter:  Melanomas are usually (but not always) greater than 6mm - the size of a pencil eraser. E for evolution:  The spot keeps changing in size, shape, and color.  Please check your skin once per month between visits. You can use a small mirror in front and a large mirror behind you to keep an eye on the back side or your body.   If you see any new or changing lesions before your next follow-up, please call to schedule a visit.  Please continue daily skin protection including broad spectrum sunscreen SPF 30+ to sun-exposed areas, reapplying every 2 hours as needed when you're outdoors.   Staying in the shade or wearing long sleeves, sun glasses (UVA+UVB protection) and wide brim hats (4-inch brim around the entire circumference of the hat) are also recommended for sun protection.    Due to recent changes in healthcare laws, you may see results of your pathology and/or laboratory studies on MyChart before the doctors have had a chance to review them. We understand that in some cases there may be results that are confusing or concerning to you. Please understand that not all results are received at the  same time and often the doctors may need to interpret multiple results in order to provide you with the best plan of care or course of treatment. Therefore, we ask that you please give us  2 business days to thoroughly review all your results before contacting the office for clarification. Should we see a critical lab result, you will be contacted sooner.   If You Need Anything After Your Visit  If you have any questions or concerns for your doctor, please call our main line at [phone] and press option 4 to reach your doctor's medical assistant. If no one answers, please leave a voicemail as directed and we will return your call as soon as possible.  Messages left after 4 pm will be answered the following business day.   You may also send us  a message via MyChart. We typically respond to MyChart messages within 1-2 business days.  For prescription refills, please ask your pharmacy to contact our office. Our fax number is [phone].  If you have an urgent issue when the clinic is closed that cannot wait until the next business day, you can page your doctor at the number below.    Please note that while we do our best to be available for urgent issues outside of office hours, we are not available 24/7.   If you have an urgent issue and are unable to reach us , you may choose to seek medical care at your doctor's office, retail clinic, urgent care center, or emergency room.  If you have a medical emergency, please immediately call 911 or go to the emergency department.  Pager Numbers  - Dr. Hester: [phone]  - Dr. Jackquline: [phone]  - Dr. Claudene: [phone]   - Dr. Raymund: [phone]  In the event of inclement weather, please call our main line at [phone] for an update on the status of any delays or closures.  Dermatology Medication Tips: Please keep the boxes that topical medications come in in order to help keep track of the instructions about where and how to use these. Pharmacies typically print the medication instructions only on the boxes and not directly on the medication tubes.   If your medication is too expensive, please contact our office at [phone] option 4 or send us  a message through MyChart.   We are unable to tell what your co-pay for medications will be in advance as this is different depending on your insurance coverage. However, we may be able to find a substitute medication at lower cost or fill out paperwork to get insurance to cover a needed medication.   If a prior authorization is required to get your medication covered by your insurance company, please allow us  1-2 business days to  complete this process.  Drug prices often vary depending on where the prescription is filled and some pharmacies may offer cheaper prices.  The website www.goodrx.com contains coupons for medications through different pharmacies. The prices here do not account for what the cost may be with help from insurance (it may be cheaper with your insurance), but the website can give you the price if you did not use any insurance.  - You can print the associated coupon and take it with your prescription to the pharmacy.  - You may also stop by our office during regular business hours and pick up a GoodRx coupon card.  - If you need your prescription sent electronically to a different pharmacy, notify our office through [facility] or by phone at  [phone] option 4.     Si Usted Necesita Algo Despus de Su Visita  Tambin puede enviarnos un mensaje a travs de Clinical Cytogeneticist. Por lo general respondemos a los mensajes de MyChart en el transcurso de 1 a 2 das hbiles.  Para renovar recetas, por favor pida a su farmacia que se ponga en contacto con nuestra oficina. Randi lakes de fax es [place] [phone].  Si tiene un asunto urgente cuando la clnica est cerrada y que no puede esperar hasta el siguiente da hbil, puede llamar/localizar a su doctor(a) al nmero que aparece a continuacin.   Por favor, tenga en cuenta que aunque hacemos todo lo posible para estar disponibles para asuntos urgentes fuera del horario de [place], no estamos disponibles las 24 horas del da, los 7 [address]  [address] de la [place].   Si tiene un problema urgente y no puede comunicarse con nosotros, puede optar por buscar atencin mdica  en el consultorio de su doctor(a), en una clnica privada, en un centro de atencin urgente o en una sala de emergencias.  Si tiene engineer, drilling, por favor llame inmediatamente al 911 o vaya a la sala de emergencias.  Nmeros de bper  - Dr. Hester: [phone]  - Dra. Jackquline:  [phone]  - Dr. Claudene: [phone]  - Dra. Kitts: [phone]  En caso de inclemencias del [place], por favor llame a nuestra lnea principal al [phone] para una actualizacin sobre el estado de cualquier retraso o cierre.  Consejos para la medicacin en dermatologa: Por favor, guarde las cajas en las que vienen los medicamentos de uso tpico para ayudarle a seguir las instrucciones sobre dnde y cmo usarlos. Las farmacias generalmente imprimen las instrucciones del medicamento slo en las cajas y no directamente en los tubos del [place].   Si su medicamento es muy caro, por favor, pngase en contacto con landry rieger llamando al [phone] y presione la opcin 4 o envenos un mensaje a travs de Clinical Cytogeneticist.   No podemos decirle cul ser su copago por los medicamentos por adelantado ya que esto es diferente dependiendo de la cobertura de su seguro. Sin embargo, es posible que podamos encontrar un medicamento sustituto a audiological scientist un formulario para que el seguro cubra el medicamento que se considera necesario.   Si se requiere una autorizacin previa para que su compaa de seguros cubra su medicamento, por favor permtanos de 1 a 2 das hbiles para completar este proceso.  Los precios de los medicamentos varan con frecuencia dependiendo del environmental consultant de dnde se surte la receta y alguna farmacias pueden ofrecer precios ms baratos.  El sitio web www.goodrx.com tiene cupones para medicamentos de health and safety inspector. Los precios aqu no tienen en cuenta lo que podra costar con la ayuda del seguro (puede ser ms barato con su seguro), pero el sitio web puede darle el precio si no utiliz tourist information centre manager.  - Puede imprimir el cupn correspondiente y llevarlo con su receta a la farmacia.  - Tambin puede pasar por nuestra oficina durante el horario de atencin regular y education officer, museum una tarjeta de cupones de GoodRx.  - Si necesita que su receta se enve electrnicamente a una  farmacia diferente, informe a nuestra oficina a travs de MyChart de [place] o por telfono llamando al [phone] y presione la opcin 4.

## 2024-03-31 ENCOUNTER — Encounter: Payer: Self-pay | Admitting: Dermatology

## 2024-04-22 ENCOUNTER — Telehealth: Payer: Self-pay | Admitting: Student

## 2024-04-22 NOTE — Telephone Encounter (Signed)
 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

## 2024-04-26 NOTE — Telephone Encounter (Signed)
 Pt made aware Barnie is currently out of the office and will review forms upon return. Pt verbalized understanding.

## 2024-04-26 NOTE — Telephone Encounter (Signed)
 Pt following back up. Please advise.

## 2024-05-01 NOTE — Telephone Encounter (Signed)
 Form signed, copied for batch scanning, VM left on pt's phone and [facility] message sent  Original placed in bin for pt pick up

## 2024-05-28 ENCOUNTER — Other Ambulatory Visit: Payer: Self-pay | Admitting: Cardiovascular Disease

## 2024-05-31 MED ORDER — ATORVASTATIN CALCIUM 40 MG PO TABS: 40.0000 mg | ORAL_TABLET | Freq: Every day | ORAL | 3 refills | Status: AC

## 2024-07-31 ENCOUNTER — Ambulatory Visit: Admitting: Family Medicine

## 2024-09-18 ENCOUNTER — Ambulatory Visit: Admitting: Dermatology
